# Patient Record
Sex: Female | Born: 1960
Health system: Southern US, Community
[De-identification: ages and names within clinical notes are randomized; demographics above are authoritative.]

## PROBLEM LIST (undated history)

## (undated) DIAGNOSIS — M51369 Other intervertebral disc degeneration, lumbar region without mention of lumbar back pain or lower extremity pain: Secondary | ICD-10-CM

## (undated) DIAGNOSIS — K219 Gastro-esophageal reflux disease without esophagitis: Secondary | ICD-10-CM

## (undated) DIAGNOSIS — M1990 Unspecified osteoarthritis, unspecified site: Secondary | ICD-10-CM

## (undated) DIAGNOSIS — K259 Gastric ulcer, unspecified as acute or chronic, without hemorrhage or perforation: Secondary | ICD-10-CM

## (undated) DIAGNOSIS — M5126 Other intervertebral disc displacement, lumbar region: Secondary | ICD-10-CM

## (undated) DIAGNOSIS — M5136 Other intervertebral disc degeneration, lumbar region: Secondary | ICD-10-CM

## (undated) DIAGNOSIS — J302 Other seasonal allergic rhinitis: Secondary | ICD-10-CM

## (undated) HISTORY — DX: Other intervertebral disc displacement, lumbar region: M51.26

## (undated) HISTORY — DX: Other seasonal allergic rhinitis: J30.2

## (undated) HISTORY — DX: Gastro-esophageal reflux disease without esophagitis: K21.9

## (undated) HISTORY — DX: Unspecified osteoarthritis, unspecified site: M19.90

## (undated) HISTORY — DX: Other intervertebral disc degeneration, lumbar region: M51.36

## (undated) HISTORY — DX: Other intervertebral disc degeneration, lumbar region without mention of lumbar back pain or lower extremity pain: M51.369

## (undated) HISTORY — DX: Gastric ulcer, unspecified as acute or chronic, without hemorrhage or perforation: K25.9

---

## 1998-09-25 ENCOUNTER — Encounter: Payer: Self-pay | Admitting: Gynecology

## 1998-09-25 ENCOUNTER — Ambulatory Visit (HOSPITAL_COMMUNITY): Admission: RE | Admit: 1998-09-25 | Discharge: 1998-09-25 | Payer: Self-pay | Admitting: Gynecology

## 1999-01-20 ENCOUNTER — Emergency Department (HOSPITAL_COMMUNITY): Admission: EM | Admit: 1999-01-20 | Discharge: 1999-01-20 | Payer: Self-pay

## 1999-09-16 DIAGNOSIS — K259 Gastric ulcer, unspecified as acute or chronic, without hemorrhage or perforation: Secondary | ICD-10-CM

## 1999-09-16 HISTORY — DX: Gastric ulcer, unspecified as acute or chronic, without hemorrhage or perforation: K25.9

## 2000-06-22 ENCOUNTER — Other Ambulatory Visit: Admission: RE | Admit: 2000-06-22 | Discharge: 2000-06-22 | Payer: Self-pay | Admitting: Gynecology

## 2001-04-02 ENCOUNTER — Encounter (INDEPENDENT_AMBULATORY_CARE_PROVIDER_SITE_OTHER): Payer: Self-pay | Admitting: Specialist

## 2001-04-02 ENCOUNTER — Other Ambulatory Visit: Admission: RE | Admit: 2001-04-02 | Discharge: 2001-04-02 | Payer: Self-pay | Admitting: Gastroenterology

## 2001-08-05 ENCOUNTER — Other Ambulatory Visit: Admission: RE | Admit: 2001-08-05 | Discharge: 2001-08-05 | Payer: Self-pay | Admitting: Gynecology

## 2001-09-15 HISTORY — PX: ANTERIOR AND POSTERIOR REPAIR: SHX1172

## 2002-09-01 ENCOUNTER — Other Ambulatory Visit: Admission: RE | Admit: 2002-09-01 | Discharge: 2002-09-01 | Payer: Self-pay | Admitting: *Deleted

## 2002-11-10 ENCOUNTER — Observation Stay (HOSPITAL_COMMUNITY): Admission: RE | Admit: 2002-11-10 | Discharge: 2002-11-11 | Payer: Self-pay | Admitting: *Deleted

## 2004-01-31 ENCOUNTER — Other Ambulatory Visit: Admission: RE | Admit: 2004-01-31 | Discharge: 2004-01-31 | Payer: Self-pay | Admitting: Obstetrics and Gynecology

## 2005-01-06 ENCOUNTER — Ambulatory Visit: Payer: Self-pay | Admitting: Internal Medicine

## 2005-01-13 ENCOUNTER — Ambulatory Visit: Payer: Self-pay | Admitting: Internal Medicine

## 2005-06-30 ENCOUNTER — Ambulatory Visit: Payer: Self-pay | Admitting: Internal Medicine

## 2005-07-10 ENCOUNTER — Encounter: Admission: RE | Admit: 2005-07-10 | Discharge: 2005-07-10 | Payer: Self-pay | Admitting: Internal Medicine

## 2005-07-10 ENCOUNTER — Ambulatory Visit: Payer: Self-pay | Admitting: Internal Medicine

## 2005-09-25 ENCOUNTER — Ambulatory Visit: Payer: Self-pay | Admitting: Family Medicine

## 2005-10-21 ENCOUNTER — Emergency Department (HOSPITAL_COMMUNITY): Admission: EM | Admit: 2005-10-21 | Discharge: 2005-10-21 | Payer: Self-pay | Admitting: Family Medicine

## 2006-12-18 ENCOUNTER — Encounter: Admission: RE | Admit: 2006-12-18 | Discharge: 2006-12-18 | Payer: Self-pay | Admitting: Occupational Medicine

## 2008-09-01 ENCOUNTER — Ambulatory Visit: Payer: Self-pay | Admitting: Internal Medicine

## 2008-09-01 DIAGNOSIS — R030 Elevated blood-pressure reading, without diagnosis of hypertension: Secondary | ICD-10-CM | POA: Insufficient documentation

## 2008-09-03 DIAGNOSIS — K279 Peptic ulcer, site unspecified, unspecified as acute or chronic, without hemorrhage or perforation: Secondary | ICD-10-CM | POA: Insufficient documentation

## 2008-09-03 DIAGNOSIS — J309 Allergic rhinitis, unspecified: Secondary | ICD-10-CM | POA: Insufficient documentation

## 2008-09-03 DIAGNOSIS — R32 Unspecified urinary incontinence: Secondary | ICD-10-CM | POA: Insufficient documentation

## 2008-09-06 ENCOUNTER — Encounter: Payer: Self-pay | Admitting: Internal Medicine

## 2008-09-06 LAB — CONVERTED CEMR LAB
ALT: 27 units/L (ref 0–35)
AST: 25 units/L (ref 0–37)
Albumin: 3.3 g/dL — ABNORMAL LOW (ref 3.5–5.2)
Alkaline Phosphatase: 36 units/L — ABNORMAL LOW (ref 39–117)
BUN: 13 mg/dL (ref 6–23)
Basophils Absolute: 0 10*3/uL (ref 0.0–0.1)
Basophils Relative: 0.1 % (ref 0.0–3.0)
Bilirubin Urine: NEGATIVE
Bilirubin, Direct: 0.2 mg/dL (ref 0.0–0.3)
CO2: 27 meq/L (ref 19–32)
Calcium: 8.9 mg/dL (ref 8.4–10.5)
Chloride: 105 meq/L (ref 96–112)
Cholesterol: 224 mg/dL (ref 0–200)
Creatinine, Ser: 0.7 mg/dL (ref 0.4–1.2)
Crystals: NEGATIVE
Direct LDL: 157.4 mg/dL
Eosinophils Absolute: 0.1 10*3/uL (ref 0.0–0.7)
Eosinophils Relative: 2.2 % (ref 0.0–5.0)
GFR calc Af Amer: 115 mL/min
GFR calc non Af Amer: 95 mL/min
Glucose, Bld: 87 mg/dL (ref 70–99)
HCT: 38.7 % (ref 36.0–46.0)
HDL: 44.5 mg/dL (ref >39.0)
Hemoglobin: 13.8 g/dL (ref 12.0–15.0)
Ketones, ur: NEGATIVE mg/dL
Lymphocytes Relative: 47.2 % — ABNORMAL HIGH (ref 12.0–46.0)
MCHC: 35.5 g/dL (ref 30.0–36.0)
MCV: 88.3 fL (ref 78.0–100.0)
Monocytes Absolute: 0.3 10*3/uL (ref 0.1–1.0)
Monocytes Relative: 6 % (ref 3.0–12.0)
Mucus, UA: NEGATIVE
Neutro Abs: 2.4 10*3/uL (ref 1.4–7.7)
Neutrophils Relative %: 44.5 % (ref 43.0–77.0)
Nitrite: NEGATIVE
Platelets: 302 10*3/uL (ref 150–400)
Potassium: 4 meq/L (ref 3.5–5.1)
RBC: 4.38 M/uL (ref 3.87–5.11)
RDW: 11.3 % — ABNORMAL LOW (ref 11.5–14.6)
Sodium: 138 meq/L (ref 135–145)
Specific Gravity, Urine: 1.01 (ref 1.000–1.03)
TSH: 4.98 microintl units/mL (ref 0.35–5.50)
Total Bilirubin: 0.7 mg/dL (ref 0.3–1.2)
Total CHOL/HDL Ratio: 5
Total Protein, Urine: NEGATIVE mg/dL
Total Protein: 6.6 g/dL (ref 6.0–8.3)
Triglycerides: 153 mg/dL — ABNORMAL HIGH (ref 0–149)
Urine Glucose: NEGATIVE mg/dL
Urobilinogen, UA: 0.2 (ref 0.0–1.0)
VLDL: 31 mg/dL (ref 0–40)
WBC: 5.4 10*3/uL (ref 4.5–10.5)
pH: 8 (ref 5.0–8.0)

## 2011-01-31 NOTE — Op Note (Signed)
NAME:  Lauren Conrad, Lauren Conrad                          ACCOUNT NO.:  [id number]   MEDICAL RECORD NO.:  [id number]                   PATIENT TYPE:  AMB   LOCATION:  DAY                                  FACILITY:  [facility]   PHYSICIAN:  [place] B. Earlene Plater, M.D.               DATE OF BIRTH:  17-Aug-1961   DATE OF PROCEDURE:  11/10/2002  DATE OF DISCHARGE:                                 OPERATIVE REPORT   PREOPERATIVE DIAGNOSES:  1. Cystocele.  2. Rectocele.  3. Stress urinary incontinence.  4. Uterine prolapse.   POSTOPERATIVE DIAGNOSES:  1. Cystocele.  2. Rectocele.  3. Stress urinary incontinence.  4. Uterine prolapse.   PROCEDURE:  1. Anterior and posterior repair.  2. OV and Mentor tape sling.  3. Cystoscopy.   SURGEONS:  Chester Holstein. Earlene Plater, M.D. for the anterior and posterior repair.  Sigmund I. Patsi Sears, M.D. for the sling and cystoscopy.   ANESTHESIA:  General.   FINDINGS:  Second-degree cystocele, third-degree rectocele, mild uterine  prolapse.   SPECIMENS:  None.   BLOOD LOSS:  100.   FLUIDS:  2200.   URINE OUTPUT:  675.   COMPLICATIONS:  None.   DRAINS:  Foley (vaginal pack in the vagina).   INDICATIONS:  A patient with stress urinary incontinence and associated  cystocele, rectocele, and mild uterine prolapse.  The patient had initially  been counseled regarding hysterectomy as adjunctive treatment to reduce the  possibility of recurrent prolapse in the future.  Also had a history of  intermenstrual spotting and was initially planning on hysterectomy at time  of the above procedure.  However, on additional consideration in private,  the patient had decided to forgo the hysterectomy.  This was indicated to me  in the holding room by the patient prior to administration of any sedative  medications.  We had additional discussion in the holding room about this  and the fact that hysterectomy may reduce the chance of recurrent prolapse  in the future; however,  ultimately that it was the patient's decision  whether or not to proceed with the additional surgery.  The patient decided  to forgo the hysterectomy, and we proceeded with the procedure as outlined  above.   DESCRIPTION OF PROCEDURE:  The patient taken to the operating room and  general anesthesia obtained.  She was placed in the ski position and prepped  and draped in standard fashion.  The bladder was emptied with Foley  catheter.  Exam under anesthesia showed a normal size, anteverted uterus  with slight prolapse and other findings as outlined above.   A weighted speculum was placed into the vagina and the cystocele identified.  An approximately 1.5 cm segment of fascial mucosa just below the urethra was  left untreated for Dr. Imelda Pillow sling.  The remainder of the cystocele  was marked with Allis clamps at its superior and inferior aspects.  In the  midline, the vaginal mucosa was infiltrated with 0.5% Marcaine with  epinephrine.  The vaginal mucosa was then incised sharply with a knife at  the inferior portion.  Dissection was then carried superiorly just beneath  the bladder mucosa with scissors.  Allis clamps were used to place each cut  edge on traction, and the vesicovaginal fascia was then dissected away  sharply with Metzenbaum scissors.  This was freshened on the right and then  repeated on the left side in a similar fashion.  The redundant vaginal  mucosa was then trimmed away and the fascia reapproximated with interrupted  figure-of-eight sutures of 2-0 Vicryl.  The vaginal mucosa was then closed  in a running stitch of 2-0 Vicryl.   Sigmund I. Patsi Sears, M.D. then did his OV tape procedures.  See his note  for details.   After Dr. Patsi Sears had completed his sling and cystoscopy, he proceeded  with a rectocele repair.   The perineum was grasped on each side with Allis clamps and perineum and  posterior vaginal mucosa infiltrated with 0.5% Marcaine with  epinephrine.  Perineum was incised transversely and the vaginal mucosa dissected off  sharply, Allis clamps then placed on each cut edge, and the rectovaginal  fascia dissected away sharply.  The redundant vaginal mucosa was trimmed  away and the fascia reapproximated with running stitch of 2-0 Vicryl.  The  vaginal mucosa was then closed with running stitch of 2-0 Vicryl.  Perineum  was reapproximated with the same suture and the skin closed over it with a  subcuticular 2-0 Vicryl.  A vaginal pack was then placed with Estrace cream.   The patient tolerated the procedure well, and there were no complications.  She was taken to the recovery room, awake, in satisfactory condition.                                               Gerri Spore B. Earlene Plater, M.D.    WBD/MEDQ  D:  11/10/2002  T:  11/10/2002  Job:  [id number]

## 2011-01-31 NOTE — H&P (Signed)
NAME:  Lauren Conrad, Lauren Conrad NO.:  [id number]   MEDICAL RECORD NO.:  [id number]                   PATIENT TYPE:  SPE   LOCATION:  DFTL                                 FACILITY:  [facility]   PHYSICIAN:  Gerri Spore B. Earlene Plater, M.D.               DATE OF BIRTH:  17-Feb-1961   DATE OF ADMISSION:  09/01/2002  DATE OF DISCHARGE:  09/01/2002                                HISTORY & PHYSICAL   PREOPERATIVE DIAGNOSES:  1. Symptomatic uterine prolapse.  2. Cystocele.  3. Rectocele.  4. Stress urinary incontinence.  5. Abnormal uterine bleeding.   HISTORY OF PRESENT ILLNESS:  A 50 year old white female, gravida 1, para 1,  with a greater than six month history of intermenstrual bleeding despite use  of Ortho Evra patch.  Also says she has had symptoms of leakage of urine  when laughing, straining, or coughing, has been doing Kegel exercises  without improvement in symptoms.  The patient has been evaluated by Sigmund  I. Patsi Sears, M.D. and was found to be a good candidate for a sling.   Preop evaluation included Pap smear that was normal.  Endometrial biopsy  which showed proliferative endometrium, removal of a benign endocervical  polyp, and recent cultures of gonorrhea and Chlamydia which were negative.   The patient has been offered less invasive treatments and wishes to proceed  with surgical therapy.   PAST MEDICAL HISTORY:  1. Gastroenteritis after international travel.  2. Duodenal ulcer.   PAST SURGICAL HISTORY:  None.   OBSTETRICAL HISTORY:  Forcep-assisted vaginal delivery x 1, 8 pounds 10  ounces.   MEDICATIONS:  Ortho Evra patch and multivitamin.   ALLERGIES:  ERYTHROMYCIN.   SOCIAL HISTORY:  Occasional alcohol.  No tobacco or other drugs.   FAMILY HISTORY:  Diabetes, heart disease, hypertension, and mother and  paternal aunt with ovarian cancer in the 91's and 81's.   PHYSICAL EXAMINATION:  VITAL SIGNS:  Blood pressure 112/64, weight 137,  height  5 feet two inches.  GENERAL:  Alert and oriented, in no acute distress.  SKIN:  Warm and dry, no lesions.  HEART:  Regular rate and rhythm.  LUNGS:  Clear to auscultation.  ABDOMEN:  Liver, spleen, no evidence of hernia.  LYMPH NODES:  Surveyed and negative in neck, axilla, and groin.  BREASTS:  No dominant masses, nipple discharge, or axillary adenopathy.  PELVIC:  Normal external genitalia.  Vagina second-degree cystocele and  rectocele with a hypermobile urethra.  Uterus has small amount of descensus  and is not enlarged.  Rectovaginal exam confirms both findings.   ASSESSMENT:  Pelvic organ prolapse with stress urinary incontinence and  irregular bleeding.   PLAN:  1. Laparoscopically-assisted vaginal hysterectomy.  2. Anterior and posterior repair.  3. Sling procedure by Lynelle Smoke I. Patsi Sears, M.D.  4. Discussed with patient ovarian conservation versus removal, and she would     like to  keep the ovaries if they appear normal.                                               Gerri Spore B. Earlene Plater, M.D.    WBD/MEDQ  D:  10/25/2002  T:  10/25/2002  Job:  [id number]

## 2011-01-31 NOTE — Op Note (Signed)
   NAME:  Lauren Conrad, HUHTA                          ACCOUNT NO.:  [id number]   MEDICAL RECORD NO.:  [id number]                   PATIENT TYPE:  AMB   LOCATION:  DAY                                  FACILITY:  [facility]   PHYSICIAN:  Sigmund I. Patsi Sears, M.D.         DATE OF BIRTH:  1961/05/06   DATE OF PROCEDURE:  11/10/2002  DATE OF DISCHARGE:                                 OPERATIVE REPORT   PREOPERATIVE DIAGNOSES:  Urinary incontinence.   POSTOPERATIVE DIAGNOSES:  Urinary incontinence.   OPERATION:  Mentor OB obturator pubovaginal sling.   SURGEON:  Sigmund I. Patsi Sears, M.D.   ANESTHESIA:  General endotracheal.   PREPARATION:  After appropriate preanesthesia, the patient was brought to  the operating room, placed on the operating table in the dorsal supine  position where general endotracheal anesthesia was introduced. She was  placed in the dorsal lithotomy position by Dr. Earlene Plater, and underwent anterior  vaginal vault repair. At the conclusion of this portion of the operation, 5  mL of Marcaine 0.5 with epinephrine 1:200,000 was injected into the anterior  pubovaginal cervical tissue, right around the urethra in the periurethral  area. An incision was made over the urethra and subcutaneous tissue and was  dissected bilaterally. A stab wound was made just 1 cm lateral to the  palpation of the obturator fossa with thumb on the obturator foramen, and  the passer was placed, brought into the midline wound. The tape is passed  backwards, the same incision is repeated on the opposite side, and tape is  again placed. A right angled clamp is kept behind the tape to make certain  that the tape is not too tight. The tape is cut beneath the surface of the  skin. The stab wounds are closed with Dermabond. The wound is closed in two  layers with 3-0 Vicryl suture. The patient tolerated the procedure well. She  was awakened after a rectocele repair and taken to the recovery room in good  condition.                                               Sigmund I. Patsi Sears, M.D.    SIT/MEDQ  D:  11/10/2002  T:  11/10/2002  Job:  [id number]   cc:   Gerri Spore B. Earlene Plater, M.D.  [address]  [place]  [place] [address]  Fax: [phone]

## 2013-07-29 ENCOUNTER — Telehealth: Payer: Self-pay | Admitting: Gastroenterology

## 2013-07-29 NOTE — Telephone Encounter (Signed)
Patient is requesting to switch care from Dr. Arlyce Dice to Dr. Juanda Chance. She states she was not satisfied with Dr. Arlyce Dice and wishes to see someone else.

## 2013-07-29 NOTE — Telephone Encounter (Signed)
Dr Arlyce Dice, OK for pt to switch to Dr Juanda Chance? Thanks.

## 2013-08-01 NOTE — Telephone Encounter (Signed)
OK 

## 2013-08-01 NOTE — Telephone Encounter (Signed)
Unable to schedule an appt at this time. Will call pt back to schedule a colon once February schedule is out for Dr. Juanda Chance.

## 2013-08-01 NOTE — Telephone Encounter (Signed)
ok 

## 2013-08-01 NOTE — Telephone Encounter (Signed)
Dr Brodie, will you accept this pt? Thanks. 

## 2013-08-08 ENCOUNTER — Encounter: Payer: Self-pay | Admitting: Internal Medicine

## 2013-10-12 ENCOUNTER — Ambulatory Visit (AMBULATORY_SURGERY_CENTER): Payer: Self-pay | Admitting: *Deleted

## 2013-10-12 VITALS — Ht 62.0 in | Wt 154.8 lb

## 2013-10-12 DIAGNOSIS — Z1211 Encounter for screening for malignant neoplasm of colon: Secondary | ICD-10-CM

## 2013-10-12 MED ORDER — MOVIPREP 100 G PO SOLR: ORAL | Status: DC

## 2013-10-12 NOTE — Progress Notes (Signed)
No allergies to eggs or soy. No problems with anesthesia.  

## 2013-10-19 ENCOUNTER — Encounter: Payer: Self-pay | Admitting: Internal Medicine

## 2013-10-28 ENCOUNTER — Encounter: Payer: Self-pay | Admitting: Internal Medicine

## 2013-10-28 ENCOUNTER — Ambulatory Visit (AMBULATORY_SURGERY_CENTER): Payer: BC Managed Care – PPO | Admitting: Internal Medicine

## 2013-10-28 VITALS — BP 112/70 | HR 76 | Temp 96.8°F | Resp 21 | Ht 62.0 in | Wt 154.0 lb

## 2013-10-28 DIAGNOSIS — Z1211 Encounter for screening for malignant neoplasm of colon: Secondary | ICD-10-CM

## 2013-10-28 MED ORDER — SODIUM CHLORIDE 0.9 % IV SOLN: 500.0000 mL | INTRAVENOUS | Status: DC

## 2013-10-28 NOTE — Patient Instructions (Signed)
YOU HAD AN ENDOSCOPIC PROCEDURE TODAY AT THE [facility]: Refer to the procedure report that was given to you for any specific questions about what was found during the examination.  If the procedure report does not answer your questions, please call your gastroenterologist to clarify.  If you requested that your care partner not be given the details of your procedure findings, then the procedure report has been included in a sealed envelope for you to review at your convenience later.  YOU SHOULD EXPECT: Some feelings of bloating in the abdomen. Passage of more gas than usual.  Walking can help get rid of the air that was put into your GI tract during the procedure and reduce the bloating. If you had a lower endoscopy (such as a colonoscopy or flexible sigmoidoscopy) you may notice spotting of blood in your stool or on the toilet paper. If you underwent a bowel prep for your procedure, then you may not have a normal bowel movement for a few days.  DIET: Your first meal following the procedure should be a light meal and then it is ok to progress to your normal diet.  A half-sandwich or bowl of soup is an example of a good first meal.  Heavy or fried foods are harder to digest and may make you feel nauseous or bloated.  Likewise meals heavy in dairy and vegetables can cause extra gas to form and this can also increase the bloating.  Drink plenty of fluids but you should avoid alcoholic beverages for 24 hours.  ACTIVITY: Your care partner should take you home directly after the procedure.  You should plan to take it easy, moving slowly for the rest of the day.  You can resume normal activity the day after the procedure however you should NOT DRIVE or use heavy machinery for 24 hours (because of the sedation medicines used during the test).    SYMPTOMS TO REPORT IMMEDIATELY: A gastroenterologist can be reached at any hour.  During normal business hours, 8:30 AM to 5:00 PM Monday through Friday,  call [phone].  After hours and on weekends, please call the GI answering service at [phone] who will take a message and have the physician on call contact you.   Following lower endoscopy (colonoscopy or flexible sigmoidoscopy):  Excessive amounts of blood in the stool  Significant tenderness or worsening of abdominal pains  Swelling of the abdomen that is new, acute  Fever of 100F or higher    FOLLOW UP: If any biopsies were taken you will be contacted by phone or by letter within the next 1-3 weeks.  Call your gastroenterologist if you have not heard about the biopsies in 3 weeks.  Our staff will call the home number listed on your records the next business day following your procedure to check on you and address any questions or concerns that you may have at that time regarding the information given to you following your procedure. This is a courtesy call and so if there is no answer at the home number and we have not heard from you through the emergency physician on call, we will assume that you have returned to your regular daily activities without incident.  SIGNATURES/CONFIDENTIALITY: You and/or your care partner have signed paperwork which will be entered into your electronic medical record.  These signatures attest to the fact that that the information above on your After Visit Summary has been reviewed and is understood.  Full responsibility of the confidentiality   this discharge information lies with you and/or your care-partner.  Diverticulosis and high fiber diet information given.  Next colonoscopy 10 years-2025. 

## 2013-10-28 NOTE — Op Note (Signed)
[place]  [address] [place], [address]   COLONOSCOPY PROCEDURE REPORT  PATIENT: Lauren Conrad, Lauren Conrad  MR#: [id number] BIRTHDATE: [birth year] , 53  yrs. old GENDER: Female ENDOSCOPIST: Lafayette Dragon, MD REFERRED WC:[id number] Dellis Filbert, M.D. , Dr M.Krietz PROCEDURE DATE:  10/28/2013 PROCEDURE:   Colonoscopy, screening First Screening Colonoscopy - Avg.  risk and is 50 yrs.  old or older - No.  Prior Negative Screening - Now for repeat screening. 10 or more years since last screening  History of Adenoma - Now for follow-up colonoscopy & has been > or = to 3 yrs.  N/A  Polyps Removed Today? No.  Recommend repeat exam, <10 yrs? No. ASA CLASS:   Class I INDICATIONS:Average risk patient for colon cancer. MEDICATIONS: MAC sedation, administered by CRNA and propofol (Diprivan) 200mg  IV  DESCRIPTION OF PROCEDURE:   After the risks benefits and alternatives of the procedure were thoroughly explained, informed consent was obtained.  A digital rectal exam revealed no abnormalities of the rectum.   The LB [id number]-H190 [id number]  endoscope was introduced through the anus and advanced to the cecum, which was identified by both the appendix and ileocecal valve. No adverse events experienced.   The quality of the prep was good, using MoviPrep  The instrument was then slowly withdrawn as the colon was fully examined.      COLON FINDINGS: Mild diverticulosis was noted in the descending colon.  Retroflexed views revealed no abnormalities. The time to cecum=11 minutes 57 seconds.  Withdrawal time=6 minutes 1 seconds. The scope was withdrawn and the procedure completed. COMPLICATIONS: There were no complications.  ENDOSCOPIC IMPRESSION: Mild diverticulosis was noted in the descending colon  RECOMMENDATIONS: 1.  High fiber diet 2.   recall colon 10 years   eSigned:  Lafayette Dragon, MD 10/28/2013 8:45 AM   cc:   PATIENT NAME:  [name], [name] MR#:  [id number]

## 2013-10-28 NOTE — Progress Notes (Signed)
Procedure ends, to recovery, report given and VSS. 

## 2013-10-31 ENCOUNTER — Telehealth: Payer: Self-pay | Admitting: *Deleted

## 2013-10-31 NOTE — Telephone Encounter (Signed)
  Follow up Call-  Call back number 10/28/2013  Post procedure Call Back phone  # [phone] cell  Permission to leave phone message Yes     Patient questions:  Do you have a fever, pain , or abdominal swelling? no Pain Score  0 *  Have you tolerated food without any problems? yes  Have you been able to return to your normal activities? yes  Do you have any questions about your discharge instructions: Diet   no Medications  no Follow up visit  no  Do you have questions or concerns about your Care? no  Actions: * If pain score is 4 or above: No action needed, pain <4.

## 2017-01-08 ENCOUNTER — Ambulatory Visit (INDEPENDENT_AMBULATORY_CARE_PROVIDER_SITE_OTHER): Payer: BLUE CROSS/BLUE SHIELD | Admitting: Obstetrics & Gynecology

## 2017-01-08 ENCOUNTER — Encounter: Payer: Self-pay | Admitting: Obstetrics & Gynecology

## 2017-01-08 VITALS — BP 144/90 | Ht 62.75 in | Wt 166.0 lb

## 2017-01-08 DIAGNOSIS — Z1151 Encounter for screening for human papillomavirus (HPV): Secondary | ICD-10-CM | POA: Diagnosis not present

## 2017-01-08 DIAGNOSIS — N952 Postmenopausal atrophic vaginitis: Secondary | ICD-10-CM

## 2017-01-08 DIAGNOSIS — Z01411 Encounter for gynecological examination (general) (routine) with abnormal findings: Secondary | ICD-10-CM | POA: Diagnosis not present

## 2017-01-08 NOTE — Progress Notes (Signed)
Lauren Conrad [birth year] [id number]   History:    56 y.o.  G1P1 married.  Established patient presenting for annual gyn exam.  Menopause.  No HRT.  No PMB.  No pelvic pain.  No recent Cystitis.  C/O irritation/inflammation at urethral meatus on-off since Cystoscopy.  No current Sx.  Breasts wnl.  Stress at work.  Physically active.  Past medical history,surgical history, family history and social history were all reviewed and documented in the EPIC chart.  Gynecologic History No LMP recorded. Patient is not currently having periods (Reason: Perimenopausal). Contraception: post menopausal status Last Pap: 2016. Results were: normal Last mammogram: 2016. Results were: normal Colonoscopy 2015  Obstetric History OB History  Gravida Para Term Preterm AB Living  1 1       1   SAB TAB Ectopic Multiple Live Births               # Outcome Date GA Lbr Len/2nd Weight Sex Delivery Anes PTL Lv  1 Para                ROS: A ROS was performed and pertinent positives and negatives are included in the history.  GENERAL: No fevers or chills. HEENT: No change in vision, no earache, sore throat or sinus congestion. NECK: No pain or stiffness. CARDIOVASCULAR: No chest pain or pressure. No palpitations. PULMONARY: No shortness of breath, cough or wheeze. GASTROINTESTINAL: No abdominal pain, nausea, vomiting or diarrhea, melena or bright red blood per rectum. GENITOURINARY: No urinary frequency, urgency, hesitancy or dysuria. MUSCULOSKELETAL: No joint or muscle pain, no back pain, no recent trauma. DERMATOLOGIC: No rash, no itching, no lesions. ENDOCRINE: No polyuria, polydipsia, no heat or cold intolerance. No recent change in weight. HEMATOLOGICAL: No anemia or easy bruising or bleeding. NEUROLOGIC: No headache, seizures, numbness, tingling or weakness. PSYCHIATRIC: No depression, no loss of interest in normal activity or change in sleep pattern.     Exam:   BP (!) 144/90   Ht 5' 2.75" (1.594  m)   Wt 166 lb (75.3 kg)   BMI 29.64 kg/m   Body mass index is 29.64 kg/m.  General appearance : Well developed well nourished female. No acute distress HEENT: Eyes: no retinal hemorrhage or exudates,  Neck supple, trachea midline, no carotid bruits, no thyroidmegaly Lungs: Clear to auscultation, no rhonchi or wheezes, or rib retractions  Heart: Regular rate and rhythm, no murmurs or gallops Breast:Examined in sitting and supine position were symmetrical in appearance, no palpable masses or tenderness,  no skin retraction, no nipple inversion, no nipple discharge, no skin discoloration, no axillary or supraclavicular lymphadenopathy Abdomen: no palpable masses or tenderness, no rebound or guarding Extremities: no edema or skin discoloration or tenderness  Pelvic:  Bartholin, Urethra, Skene Glands: Within normal limits.  No sign of inflammation around urethra today.             Vagina: No gross lesions or discharge  Cervix: No gross lesions or discharge.  Pap done.  Uterus  AV, normal size, shape and consistency, non-tender and mobile  Adnexa  Without masses or tenderness  Anus and perineum  normal    Assessment/Plan:  57 y.o. female for annual exam  1. Encounter for gynecological examination with abnormal finding Normal Gyn exam. Pap/HPV pending. Normal Breast exam.  Declines annual screening Mammo.  2. Atrophic vaginitis Asymptomatic.  No current peri-urethral irritation/inflammation.  Counseling done.  Will continue warm water with shower head.  Vaseline to prevent irritation on  very active days recommended.   Princess Bruins MD, 4:50 PM 01/08/2017

## 2017-01-08 NOTE — Patient Instructions (Addendum)
Your Annual/Gyn exam was normal today.  A Pap test was done.  I will let you know the result as soon as available.  Your Breast exam was normal and you declined doing a screening Mammogram every year.  I encourage you to continue with a healthy nutrition and regular physical activity.  Enjoy your Lex[employer]!  If your irritation/inflammation recurs around the urethra, don't hesitate to call so that I can examine you when the problem is present.  Trilby, it was a pleasure to see you today!

## 2017-01-09 NOTE — Addendum Note (Signed)
Addended by: Thurnell Garbe A on: 01/09/2017 10:08 AM   Modules accepted: Orders

## 2017-01-13 LAB — PAP, TP IMAGING W/ HPV RNA, RFLX HPV TYPE 16,18/45: HPV MRNA, HIGH RISK: NOT DETECTED

## 2017-03-11 DIAGNOSIS — M545 Low back pain: Secondary | ICD-10-CM | POA: Diagnosis not present

## 2017-03-11 DIAGNOSIS — M5442 Lumbago with sciatica, left side: Secondary | ICD-10-CM | POA: Diagnosis not present

## 2017-04-02 ENCOUNTER — Other Ambulatory Visit: Payer: Self-pay | Admitting: Internal Medicine

## 2017-04-02 DIAGNOSIS — Z0000 Encounter for general adult medical examination without abnormal findings: Secondary | ICD-10-CM | POA: Diagnosis not present

## 2017-04-02 DIAGNOSIS — M5416 Radiculopathy, lumbar region: Secondary | ICD-10-CM | POA: Diagnosis not present

## 2017-04-02 DIAGNOSIS — Z780 Asymptomatic menopausal state: Secondary | ICD-10-CM | POA: Diagnosis not present

## 2017-04-08 DIAGNOSIS — Z780 Asymptomatic menopausal state: Secondary | ICD-10-CM | POA: Diagnosis not present

## 2017-04-16 ENCOUNTER — Ambulatory Visit: Admission: RE | Admit: 2017-04-16 | Payer: 59 | Source: Ambulatory Visit

## 2017-04-29 DIAGNOSIS — Z85828 Personal history of other malignant neoplasm of skin: Secondary | ICD-10-CM | POA: Diagnosis not present

## 2017-04-29 DIAGNOSIS — D225 Melanocytic nevi of trunk: Secondary | ICD-10-CM | POA: Diagnosis not present

## 2017-04-29 DIAGNOSIS — D2272 Melanocytic nevi of left lower limb, including hip: Secondary | ICD-10-CM | POA: Diagnosis not present

## 2017-05-04 DIAGNOSIS — H04123 Dry eye syndrome of bilateral lacrimal glands: Secondary | ICD-10-CM | POA: Diagnosis not present

## 2017-06-17 DIAGNOSIS — M5416 Radiculopathy, lumbar region: Secondary | ICD-10-CM | POA: Diagnosis not present

## 2017-06-17 DIAGNOSIS — M5136 Other intervertebral disc degeneration, lumbar region: Secondary | ICD-10-CM | POA: Diagnosis not present

## 2017-06-18 ENCOUNTER — Other Ambulatory Visit: Payer: Self-pay | Admitting: Physical Medicine and Rehabilitation

## 2017-06-18 DIAGNOSIS — M5416 Radiculopathy, lumbar region: Secondary | ICD-10-CM

## 2017-06-29 ENCOUNTER — Ambulatory Visit
Admission: RE | Admit: 2017-06-29 | Discharge: 2017-06-29 | Disposition: A | Discharge status: Home / Self Care | Payer: 59 | Source: Ambulatory Visit | Attending: Physical Medicine and Rehabilitation | Admitting: Physical Medicine and Rehabilitation

## 2017-06-29 DIAGNOSIS — M5416 Radiculopathy, lumbar region: Secondary | ICD-10-CM

## 2017-06-29 DIAGNOSIS — M48061 Spinal stenosis, lumbar region without neurogenic claudication: Secondary | ICD-10-CM | POA: Diagnosis not present

## 2017-09-04 DIAGNOSIS — M5126 Other intervertebral disc displacement, lumbar region: Secondary | ICD-10-CM | POA: Diagnosis not present

## 2017-09-04 DIAGNOSIS — M25561 Pain in right knee: Secondary | ICD-10-CM | POA: Diagnosis not present

## 2017-09-04 DIAGNOSIS — M5416 Radiculopathy, lumbar region: Secondary | ICD-10-CM | POA: Diagnosis not present

## 2017-09-24 DIAGNOSIS — M25562 Pain in left knee: Secondary | ICD-10-CM | POA: Diagnosis not present

## 2017-09-24 DIAGNOSIS — M25561 Pain in right knee: Secondary | ICD-10-CM | POA: Diagnosis not present

## 2017-09-24 DIAGNOSIS — M25572 Pain in left ankle and joints of left foot: Secondary | ICD-10-CM | POA: Diagnosis not present

## 2017-09-27 DIAGNOSIS — J069 Acute upper respiratory infection, unspecified: Secondary | ICD-10-CM | POA: Diagnosis not present

## 2017-10-02 DIAGNOSIS — M5442 Lumbago with sciatica, left side: Secondary | ICD-10-CM | POA: Diagnosis not present

## 2017-10-02 DIAGNOSIS — G8929 Other chronic pain: Secondary | ICD-10-CM | POA: Diagnosis not present

## 2017-10-16 DIAGNOSIS — G8929 Other chronic pain: Secondary | ICD-10-CM | POA: Diagnosis not present

## 2017-10-16 DIAGNOSIS — M5442 Lumbago with sciatica, left side: Secondary | ICD-10-CM | POA: Diagnosis not present

## 2018-04-29 DIAGNOSIS — Z85828 Personal history of other malignant neoplasm of skin: Secondary | ICD-10-CM | POA: Diagnosis not present

## 2018-04-29 DIAGNOSIS — D485 Neoplasm of uncertain behavior of skin: Secondary | ICD-10-CM | POA: Diagnosis not present

## 2018-04-29 DIAGNOSIS — D225 Melanocytic nevi of trunk: Secondary | ICD-10-CM | POA: Diagnosis not present

## 2018-04-29 DIAGNOSIS — D2272 Melanocytic nevi of left lower limb, including hip: Secondary | ICD-10-CM | POA: Diagnosis not present

## 2018-06-28 DIAGNOSIS — M5126 Other intervertebral disc displacement, lumbar region: Secondary | ICD-10-CM | POA: Diagnosis not present

## 2018-06-28 DIAGNOSIS — M5416 Radiculopathy, lumbar region: Secondary | ICD-10-CM | POA: Diagnosis not present

## 2018-07-12 ENCOUNTER — Encounter: Payer: 59 | Admitting: Obstetrics & Gynecology

## 2018-08-05 ENCOUNTER — Encounter: Payer: Self-pay | Admitting: Obstetrics & Gynecology

## 2018-08-05 ENCOUNTER — Ambulatory Visit (INDEPENDENT_AMBULATORY_CARE_PROVIDER_SITE_OTHER): Payer: 59 | Admitting: Obstetrics & Gynecology

## 2018-08-05 VITALS — BP 130/80 | Ht 62.25 in | Wt 162.5 lb

## 2018-08-05 DIAGNOSIS — Z23 Encounter for immunization: Secondary | ICD-10-CM

## 2018-08-05 DIAGNOSIS — R300 Dysuria: Secondary | ICD-10-CM

## 2018-08-05 DIAGNOSIS — Z01419 Encounter for gynecological examination (general) (routine) without abnormal findings: Secondary | ICD-10-CM

## 2018-08-05 DIAGNOSIS — Z780 Asymptomatic menopausal state: Secondary | ICD-10-CM

## 2018-08-05 NOTE — Progress Notes (Signed)
Lauren Conrad [birth year] [id number]   History:    57 y.o. G1P1L1 Married x 5 yrs  RP:  Established patient presenting for annual gyn exam   HPI: Menopause, well on no HRT.  No PMB.  No pelvic pain.  Bulging disc.  BMI 29.48.  Will do water fitness with an exercise bath at home.  Past medical history,surgical history, family history and social history were all reviewed and documented in the EPIC chart.  Gynecologic History No LMP recorded. (Menstrual status: Perimenopausal). Contraception: post menopausal status Last Pap: 12/2016. Results were: Negative Last mammogram: 02/2015 Negative.  Declined Bone Density: Will obtain report Colonoscopy: 2015  Obstetric History OB History  Gravida Para Term Preterm AB Living  '1 1       1  ' SAB TAB Ectopic Multiple Live Births               # Outcome Date GA Lbr Len/2nd Weight Sex Delivery Anes PTL Lv  1 Para              ROS: A ROS was performed and pertinent positives and negatives are included in the history.  GENERAL: No fevers or chills. HEENT: No change in vision, no earache, sore throat or sinus congestion. NECK: No pain or stiffness. CARDIOVASCULAR: No chest pain or pressure. No palpitations. PULMONARY: No shortness of breath, cough or wheeze. GASTROINTESTINAL: No abdominal pain, nausea, vomiting or diarrhea, melena or bright red blood per rectum. GENITOURINARY: No urinary frequency, urgency, hesitancy or dysuria. MUSCULOSKELETAL: No joint or muscle pain, no back pain, no recent trauma. DERMATOLOGIC: No rash, no itching, no lesions. ENDOCRINE: No polyuria, polydipsia, no heat or cold intolerance. No recent change in weight. HEMATOLOGICAL: No anemia or easy bruising or bleeding. NEUROLOGIC: No headache, seizures, numbness, tingling or weakness. PSYCHIATRIC: No depression, no loss of interest in normal activity or change in sleep pattern.     Exam:   BP 130/80   Ht 5' 2.25" (1.581 m)   Wt 162 lb 8 oz (73.7 kg)   BMI 29.48 kg/m    Body mass index is 29.48 kg/m.  General appearance : Well developed well nourished female. No acute distress HEENT: Eyes: no retinal hemorrhage or exudates,  Neck supple, trachea midline, no carotid bruits, no thyroidmegaly Lungs: Clear to auscultation, no rhonchi or wheezes, or rib retractions  Heart: Regular rate and rhythm, no murmurs or gallops Breast:Examined in sitting and supine position were symmetrical in appearance, no palpable masses or tenderness,  no skin retraction, no nipple inversion, no nipple discharge, no skin discoloration, no axillary or supraclavicular lymphadenopathy Abdomen: no palpable masses or tenderness, no rebound or guarding Extremities: no edema or skin discoloration or tenderness  Pelvic: Vulva: Normal             Vagina: No gross lesions or discharge  Cervix: No gross lesions or discharge  Uterus  AV, normal size, shape and consistency, non-tender and mobile  Adnexa  Without masses or tenderness  Anus: Normal  Urine analysis: Yellow clear, protein negative, nitrites negative, white blood cells negative, red blood cells 10-20, no bacteria.  Assessment/Plan:  57 y.o. female for annual exam   1. Well female exam with routine gynecological exam Normal gynecologic exam.  Pap test was negative in April 2018.  Will repeat at 2 to 3 years.  Breast exam normal.  Recommend organizing a screening mammogram now, but patient declines after counseling.  Follow-up here for fasting health labs.  Body mass index  29.48.  Recommend decrease calories/carbs in diet and increased aerobic activities to 5 times a week with weightlifting every 2 days. - CBC; Future - Comp Met (CMET); Future - TSH; Future - Lipid panel; Future - VITAMIN D 25 Hydroxy (Vit-D Deficiency, Fractures); Future  2. Postmenopausal Well on no hormone replacement therapy.  No postmenopausal bleeding.  Recommend vitamin D supplements, calcium intake of 1.5 g/day and regular weightbearing physical  activities.  Princess Bruins MD, 4:19 PM 08/05/2018

## 2018-08-07 LAB — URINALYSIS, COMPLETE W/RFL CULTURE
BILIRUBIN URINE: NEGATIVE
Bacteria, UA: NONE SEEN /HPF
Glucose, UA: NEGATIVE
HYALINE CAST: NONE SEEN /LPF
KETONES UR: NEGATIVE
Leukocyte Esterase: NEGATIVE
Nitrites, Initial: NEGATIVE
Protein, ur: NEGATIVE
SPECIFIC GRAVITY, URINE: 1.01 (ref 1.001–1.03)
WBC UA: NONE SEEN /HPF (ref 0–5)
pH: 6.5 (ref 5.0–8.0)

## 2018-08-07 LAB — URINE CULTURE
MICRO NUMBER:: 91410902
SPECIMEN QUALITY: ADEQUATE

## 2018-08-07 LAB — CULTURE INDICATED

## 2018-08-11 ENCOUNTER — Encounter: Payer: Self-pay | Admitting: Obstetrics & Gynecology

## 2018-08-11 NOTE — Patient Instructions (Signed)
1. Well female exam with routine gynecological exam Normal gynecologic exam.  Pap test was negative in April 2018.  Will repeat at 2 to 3 years.  Breast exam normal.  Recommend organizing a screening mammogram now, but patient declines after counseling.  Follow-up here for fasting health labs.  Body mass index 29.48.  Recommend decrease calories/carbs in diet and increased aerobic activities to 5 times a week with weightlifting every 2 days. - CBC; Future - Comp Met (CMET); Future - TSH; Future - Lipid panel; Future - VITAMIN D 25 Hydroxy (Vit-D Deficiency, Fractures); Future  2. Postmenopausal Well on no hormone replacement therapy.  No postmenopausal bleeding.  Recommend vitamin D supplements, calcium intake of 1.5 g/day and regular weightbearing physical activities.  Lauren Conrad, it was a pleasure seeing you today!  I will inform you of your results as soon as they are available.

## 2018-08-19 ENCOUNTER — Other Ambulatory Visit: Payer: 59

## 2018-08-24 DIAGNOSIS — Z719 Counseling, unspecified: Secondary | ICD-10-CM | POA: Diagnosis not present

## 2018-09-29 IMAGING — MR MR LUMBAR SPINE W/O CM
4 of 5 series · 18 of 48 positions shown · non-contrast
Comparison: CT abdomen and pelvis 02/05/2015.

CLINICAL DATA: Chronic low back pain and left leg pain.

EXAM:
MRI LUMBAR SPINE WITHOUT CONTRAST
TECHNIQUE: Multiplanar, multisequence MR imaging of the lumbar spine was
performed. No intravenous contrast was administered.

[Series 6: T2 · sagittal · 4.0mm · 0.73mm/px · 6 of 14 slices shown (1 of 2)]
[im 1/14]
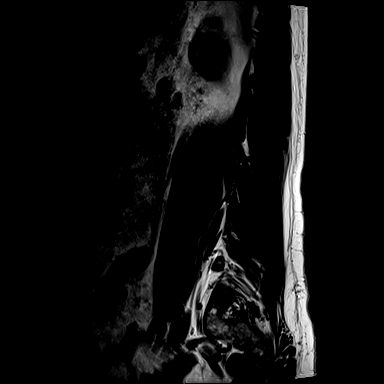
[im 3/14]
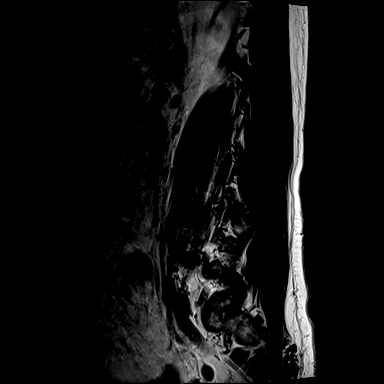
[im 6/14]
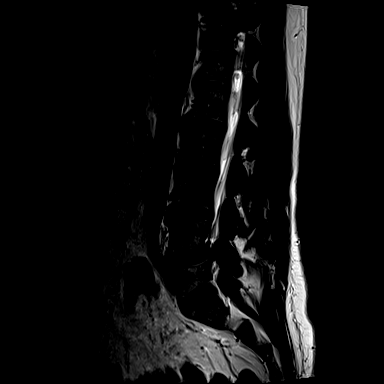
[im 8/14]
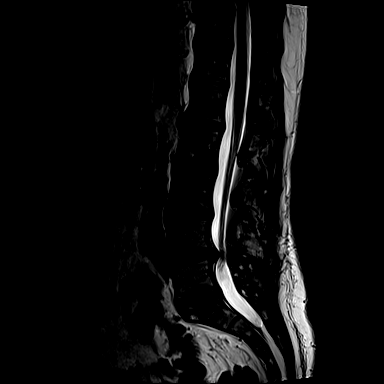
[im 11/14]
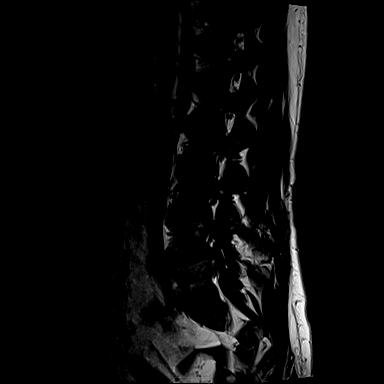
[im 14/14]
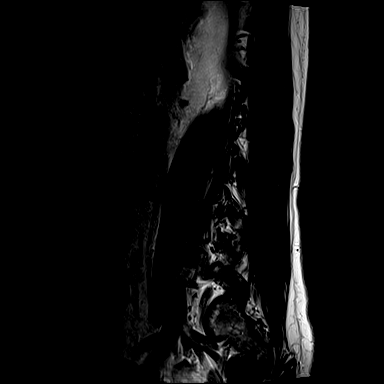

[Series 7: T1 · sagittal · 4.0mm · 0.73mm/px · 3 of 14 slices shown (1 of 2)]
[im 3/14]
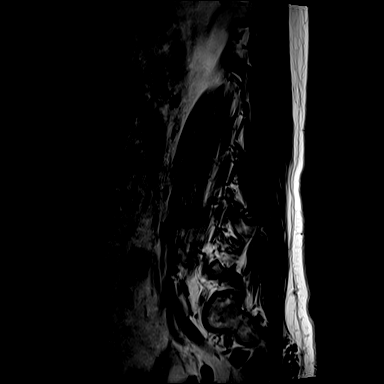
[im 8/14]
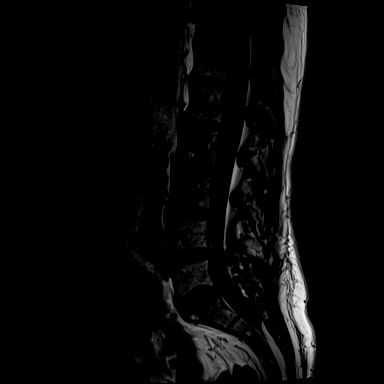
[im 14/14]
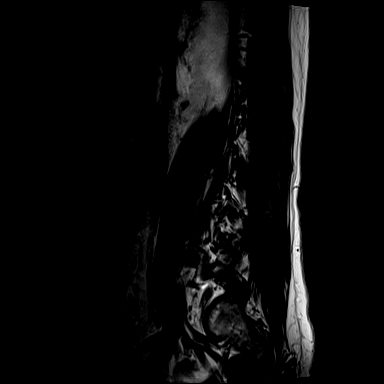

[Series 13: T2 · axial · 4.0mm · 0.28mm/px · z∈[-45,+115]mm · 6 of 38 slices shown (2 of 2)]
[im 1/38]
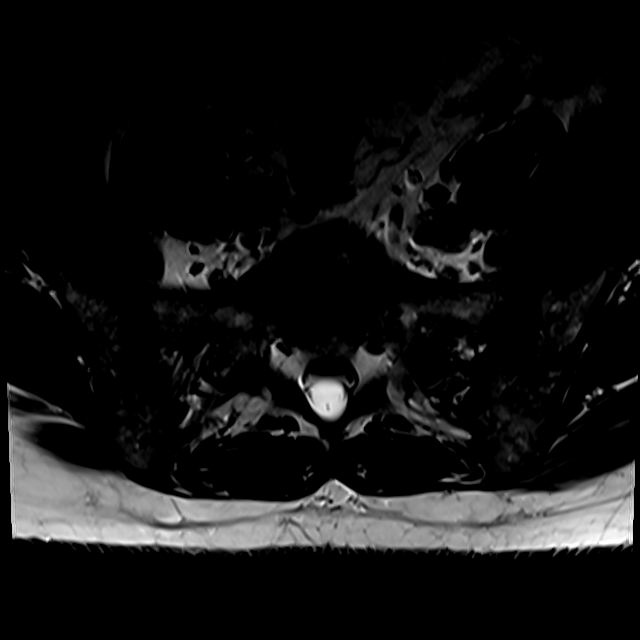
[im 6/38]
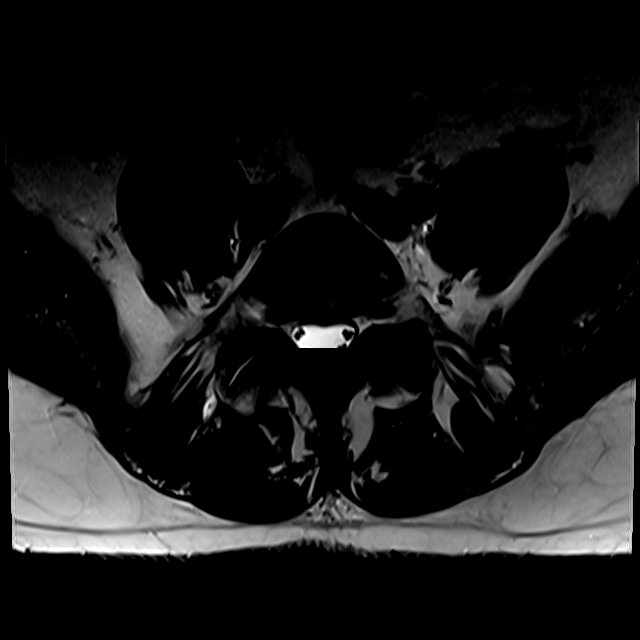
[im 11/38]
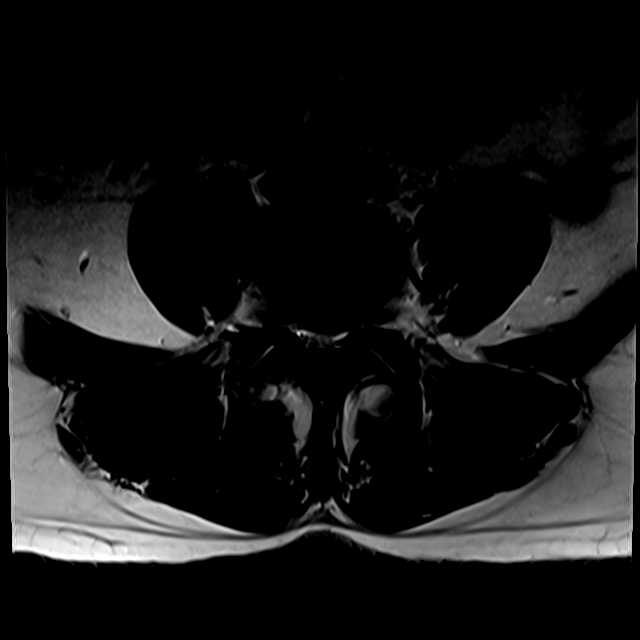
[im 16/38]
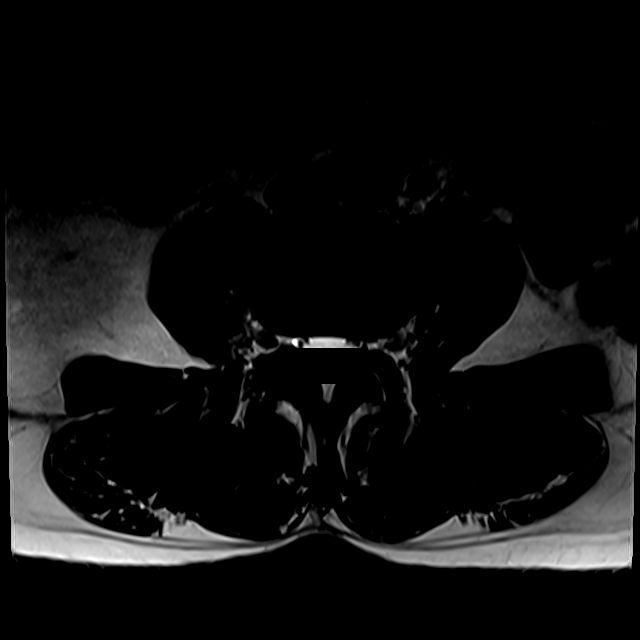
[im 19/38]
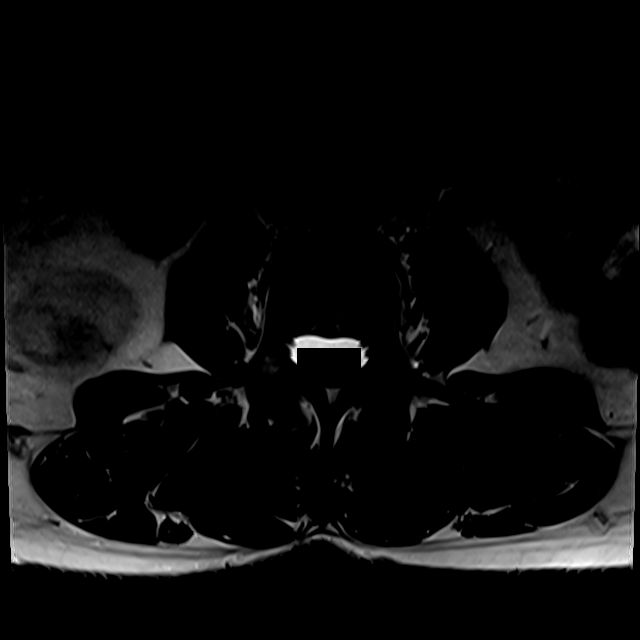
[im 32/38]
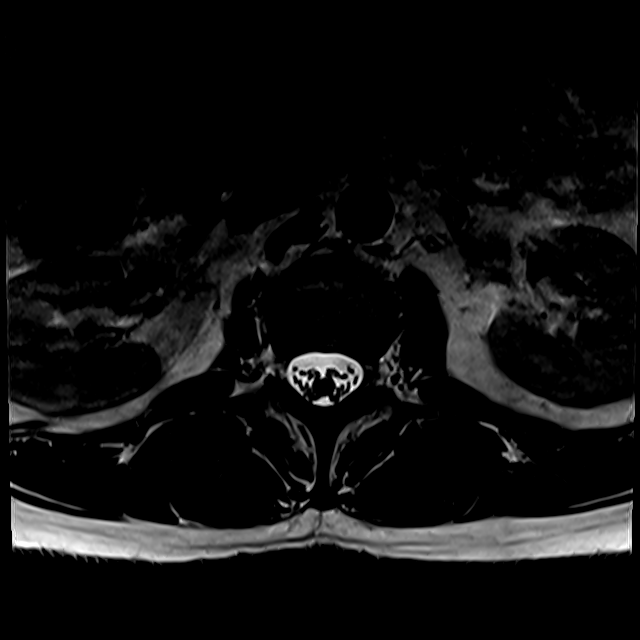

[Series 100: T1 · axial · 4.0mm · 0.28mm/px · z∈[-20,+115]mm · 3 of 38 slices shown (2 of 2)]
[im 6/38]
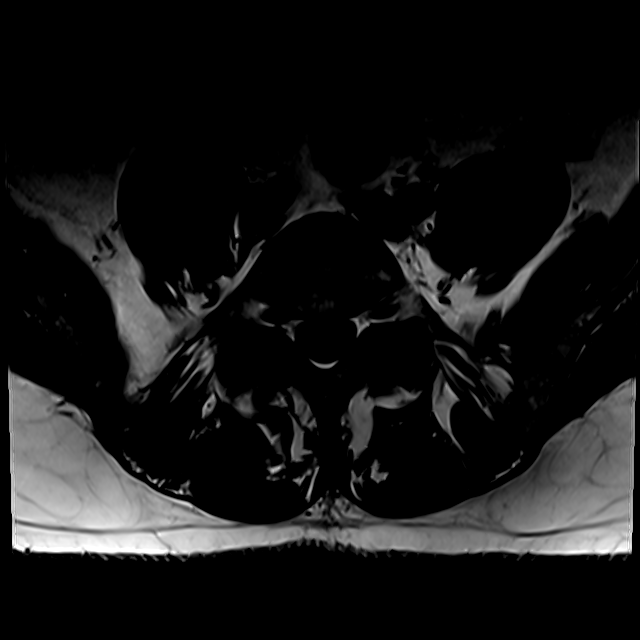
[im 19/38]
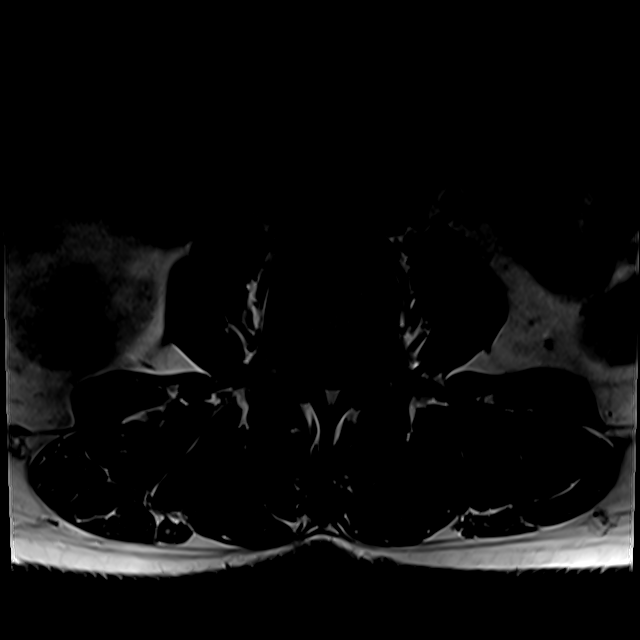
[im 32/38]
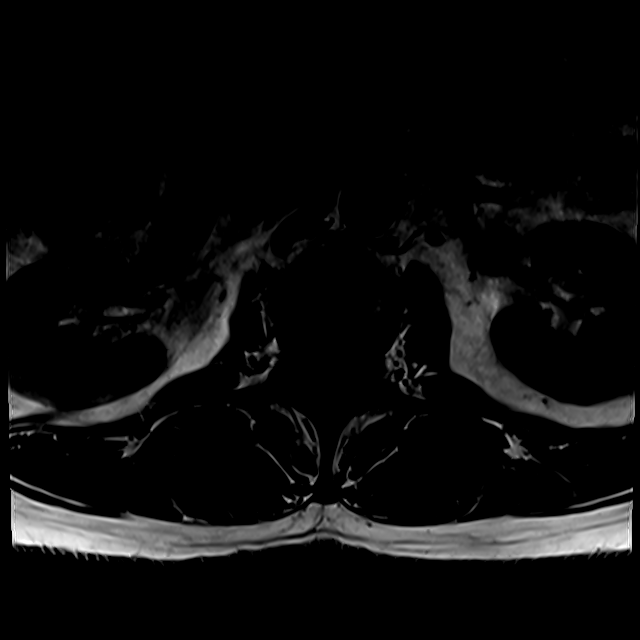

[18 of 48 positions shown; findings below may reference images not displayed]

FINDINGS: Segmentation:  Standard.

Alignment: Trace retrolisthesis of L3 on L4. Minimal left convex
curvature of the lumbar spine.

Vertebrae: No fracture or suspicious osseous lesion. Adjacent marrow
and soft tissue edema associated with L4-5 facet arthritis.

Conus medullaris: Extends to the upper L2 level and appears normal.

Paraspinal and other soft tissues: T2 hyperintense bilateral renal
lesions measuring up to 1 cm in size, likely cysts.

Disc levels:

Disc desiccation from L1-2 to L4-5. Preserved disc space heights
apart from mild narrowing at L4-5.

T12-L1 through L2-3:  Negative.

L3-4: Mild disc bulging and mild facet hypertrophy without stenosis.

L4-5: Disc bulging, broad central disc protrusion, small right
foraminal disc protrusion, mildly prominent dorsal epidural fat, and
moderate facet and ligamentum flavum hypertrophy result in severe
spinal stenosis, left greater than right lateral recess stenosis,
and mild bilateral neural foraminal stenosis. Minimal facet joint
fluid bilaterally.

L5-S1:  Normal disc.  Moderate facet arthrosis without stenosis.
IMPRESSION: 1. Severe spinal stenosis at L4-5 with a central disc protrusion
contributing.
2. Moderate L5-S1 facet arthrosis without stenosis.

## 2018-11-17 DIAGNOSIS — Z1231 Encounter for screening mammogram for malignant neoplasm of breast: Secondary | ICD-10-CM | POA: Diagnosis not present

## 2019-08-09 ENCOUNTER — Encounter: Payer: 59 | Admitting: Obstetrics & Gynecology

## 2019-11-07 ENCOUNTER — Other Ambulatory Visit: Payer: Self-pay

## 2019-11-08 ENCOUNTER — Encounter: Payer: Self-pay | Admitting: Obstetrics & Gynecology

## 2019-11-08 ENCOUNTER — Ambulatory Visit (INDEPENDENT_AMBULATORY_CARE_PROVIDER_SITE_OTHER): Payer: 59 | Admitting: Obstetrics & Gynecology

## 2019-11-08 VITALS — BP 126/80 | Ht 62.5 in | Wt 167.0 lb

## 2019-11-08 DIAGNOSIS — E6609 Other obesity due to excess calories: Secondary | ICD-10-CM

## 2019-11-08 DIAGNOSIS — Z780 Asymptomatic menopausal state: Secondary | ICD-10-CM | POA: Diagnosis not present

## 2019-11-08 DIAGNOSIS — Z6830 Body mass index (BMI) 30.0-30.9, adult: Secondary | ICD-10-CM | POA: Diagnosis not present

## 2019-11-08 DIAGNOSIS — Z01419 Encounter for gynecological examination (general) (routine) without abnormal findings: Secondary | ICD-10-CM | POA: Diagnosis not present

## 2019-11-08 NOTE — Patient Instructions (Signed)
1. Encounter for routine gynecological examination with Papanicolaou smear of cervix Normal gynecologic exam in menopause.  Pap reflex done.  Breast exam normal.  Per patient, screening mammogram done at work, will ask for report to be sent to me.  Needs to establish with a family physician with health labs annually.  2. Postmenopausal Well on no hormone replacement therapy.  No postmenopausal bleeding.  Recommend vitamin D supplements, calcium intake of 1200 mg daily and regular weightbearing physical activities to continue.  3. Class 1 obesity due to excess calories without serious comorbidity with body mass index (BMI) of 30.0 to 30.9 in adult Recommend a lower calorie/carb diet.  Aerobic physical activities 5 times a week and light weightlifting every 2 days.  Lauren Conrad, it was a pleasure seeing you today!  I will inform you of your results as soon as they are available.

## 2019-11-08 NOTE — Progress Notes (Signed)
Lauren Conrad [id number]   History:    59 y.o. G1P1L1 Married x 6 yrs  RP:  Established patient presenting for annual gyn exam   HPI: Menopause, well on no HRT.  No PMB.  No pelvic pain.  No pain with IC.  Urine/BMs normal.  Bulging disc.  BMI 30.06.  Doing water fitness with an exercise bath at home, walking, yard work, very active.  Past medical history,surgical history, family history and social history were all reviewed and documented in the EPIC chart.  Gynecologic History No LMP recorded. (Menstrual status: Perimenopausal).  Obstetric History OB History  Gravida Para Term Preterm AB Living  1 1       1   SAB TAB Ectopic Multiple Live Births               # Outcome Date GA Lbr Len/2nd Weight Sex Delivery Anes PTL Lv  1 Para              ROS: A ROS was performed and pertinent positives and negatives are included in the history.  GENERAL: No fevers or chills. HEENT: No change in vision, no earache, sore throat or sinus congestion. NECK: No pain or stiffness. CARDIOVASCULAR: No chest pain or pressure. No palpitations. PULMONARY: No shortness of breath, cough or wheeze. GASTROINTESTINAL: No abdominal pain, nausea, vomiting or diarrhea, melena or bright red blood per rectum. GENITOURINARY: No urinary frequency, urgency, hesitancy or dysuria. MUSCULOSKELETAL: No joint or muscle pain, no back pain, no recent trauma. DERMATOLOGIC: No rash, no itching, no lesions. ENDOCRINE: No polyuria, polydipsia, no heat or cold intolerance. No recent change in weight. HEMATOLOGICAL: No anemia or easy bruising or bleeding. NEUROLOGIC: No headache, seizures, numbness, tingling or weakness. PSYCHIATRIC: No depression, no loss of interest in normal activity or change in sleep pattern.     Exam:   BP 126/80 (BP Location: Right Arm, Patient Position: Sitting, Cuff Size: Normal)   Ht 5' 2.5" (1.588 m)   Wt 167 lb (75.8 kg)   BMI 30.06 kg/m   Body mass index is 30.06  kg/m.  General appearance : Well developed well nourished female. No acute distress HEENT: Eyes: no retinal hemorrhage or exudates,  Neck supple, trachea midline, no carotid bruits, no thyroidmegaly Lungs: Clear to auscultation, no rhonchi or wheezes, or rib retractions  Heart: Regular rate and rhythm, no murmurs or gallops Breast:Examined in sitting and supine position were symmetrical in appearance, no palpable masses or tenderness,  no skin retraction, no nipple inversion, no nipple discharge, no skin discoloration, no axillary or supraclavicular lymphadenopathy Abdomen: no palpable masses or tenderness, no rebound or guarding Extremities: no edema or skin discoloration or tenderness  Pelvic: Vulva: Normal             Vagina: No gross lesions or discharge  Cervix: No gross lesions or discharge.  Pap reflex done.  Uterus  AV, normal size, shape and consistency, non-tender and mobile  Adnexa  Without masses or tenderness  Anus: Normal   Assessment/Plan:  59 y.o. female for annual exam   1. Encounter for routine gynecological examination with Papanicolaou smear of cervix Normal gynecologic exam in menopause.  Pap reflex done.  Breast exam normal.  Per patient, screening mammogram done at work, will ask for report to be sent to me.  Needs to establish with a family physician with health labs annually.  2. Postmenopausal Well on no hormone replacement therapy.  No postmenopausal bleeding.  Recommend vitamin D supplements,  calcium intake of 1200 mg daily and regular weightbearing physical activities to continue.  3. Class 1 obesity due to excess calories without serious comorbidity with body mass index (BMI) of 30.0 to 30.9 in adult Recommend a lower calorie/carb diet.  Aerobic physical activities 5 times a week and light weightlifting every 2 days.  Princess Bruins MD, 4:12 PM 11/08/2019

## 2019-11-09 LAB — PAP IG W/ RFLX HPV ASCU

## 2020-11-08 ENCOUNTER — Encounter: Payer: Self-pay | Admitting: Obstetrics & Gynecology

## 2020-11-08 ENCOUNTER — Ambulatory Visit (INDEPENDENT_AMBULATORY_CARE_PROVIDER_SITE_OTHER): Payer: 59 | Admitting: Obstetrics & Gynecology

## 2020-11-08 ENCOUNTER — Other Ambulatory Visit: Payer: Self-pay

## 2020-11-08 VITALS — BP 128/84 | Ht 62.0 in | Wt 166.0 lb

## 2020-11-08 DIAGNOSIS — Z780 Asymptomatic menopausal state: Secondary | ICD-10-CM | POA: Diagnosis not present

## 2020-11-08 DIAGNOSIS — E6609 Other obesity due to excess calories: Secondary | ICD-10-CM | POA: Diagnosis not present

## 2020-11-08 DIAGNOSIS — Z6830 Body mass index (BMI) 30.0-30.9, adult: Secondary | ICD-10-CM

## 2020-11-08 DIAGNOSIS — Z01419 Encounter for gynecological examination (general) (routine) without abnormal findings: Secondary | ICD-10-CM

## 2020-11-08 NOTE — Progress Notes (Signed)
Lauren Conrad [facility] [birth year] [id number]   History:    60 y.o.  G1P1L1 Married x 7 yrs.  Son back at home.  OI:[id number] presenting for annual gyn exam   ZJQ:[id number], well on no HRT. No PMB. No pelvic pain.  No pain with IC.  Urine analysis with blood, referred to Urology by Fam MD.  BMs normal. BMI 30.36. Good fitness with walking, yard work, very active.  BD normal in 2018 per patient.  Health labs with Fam MD. Harriet Masson 2015.  Past medical history,surgical history, family history and social history were all reviewed and documented in the EPIC chart.  Gynecologic History No LMP recorded. (Menstrual status: Perimenopausal).  Obstetric History OB History  Gravida Para Term Preterm AB Living  1 1       1   SAB IAB Ectopic Multiple Live Births               # Outcome Date GA Lbr Len/2nd Weight Sex Delivery Anes PTL Lv  1 Para              ROS: A ROS was performed and pertinent positives and negatives are included in the history.  GENERAL: No fevers or chills. HEENT: No change in vision, no earache, sore throat or sinus congestion. NECK: No pain or stiffness. CARDIOVASCULAR: No chest pain or pressure. No palpitations. PULMONARY: No shortness of breath, cough or wheeze. GASTROINTESTINAL: No abdominal pain, nausea, vomiting or diarrhea, melena or bright red blood per rectum. GENITOURINARY: No urinary frequency, urgency, hesitancy or dysuria. MUSCULOSKELETAL: No joint or muscle pain, no back pain, no recent trauma. DERMATOLOGIC: No rash, no itching, no lesions. ENDOCRINE: No polyuria, polydipsia, no heat or cold intolerance. No recent change in weight. HEMATOLOGICAL: No anemia or easy bruising or bleeding. NEUROLOGIC: No headache, seizures, numbness, tingling or weakness. PSYCHIATRIC: No depression, no loss of interest in normal activity or change in sleep pattern.     Exam:   BP 128/84    Ht 5\' 2"  (1.575 m)    Wt 166 lb (75.3 kg)    BMI 30.36 kg/m   Body mass  index is 30.36 kg/m.  General appearance : Well developed well nourished female. No acute distress HEENT: Eyes: no retinal hemorrhage or exudates,  Neck supple, trachea midline, no carotid bruits, no thyroidmegaly Lungs: Clear to auscultation, no rhonchi or wheezes, or rib retractions  Heart: Regular rate and rhythm, no murmurs or gallops Breast:Examined in sitting and supine position were symmetrical in appearance, no palpable masses or tenderness,  no skin retraction, no nipple inversion, no nipple discharge, no skin discoloration, no axillary or supraclavicular lymphadenopathy Abdomen: no palpable masses or tenderness, no rebound or guarding Extremities: no edema or skin discoloration or tenderness  Pelvic: Vulva: Normal             Vagina: No gross lesions or discharge  Cervix: No gross lesions or discharge  Uterus  AV, normal size, shape and consistency, non-tender and mobile  Adnexa  Without masses or tenderness  Anus: Normal   Assessment/Plan:  60 y.o. female for annual exam   1. Well female exam with routine gynecological exam Normal gynecologic exam in menopause.  No indication to repeat a Pap test this year, Pap test - February 2021.  Breast exam normal.  Overdue for screening mammogram, will schedule.  Colonoscopy 2015.  Health labs with family physician.  2. Postmenopausal Well on no hormone replacement therapy.  No postmenopausal bleeding.  3. Class 1 obesity due  to excess calories without serious comorbidity with body mass index (BMI) of 30.0 to 30.9 in adult Mild obesity.  Recommend lower calorie/carb diet with intermittent fasting.  Aerobic activities five times a week and light weightlifting every 2 days.  Other orders - Multiple Vitamin (MULTIVITAMIN) tablet; Take 1 tablet by mouth daily.  Princess Bruins MD, 4:04 PM 11/08/2020

## 2020-11-09 ENCOUNTER — Encounter: Payer: Self-pay | Admitting: Obstetrics & Gynecology

## 2020-12-10 ENCOUNTER — Encounter: Payer: Self-pay | Admitting: Urology

## 2020-12-10 ENCOUNTER — Ambulatory Visit (INDEPENDENT_AMBULATORY_CARE_PROVIDER_SITE_OTHER): Payer: 59 | Admitting: Urology

## 2020-12-10 ENCOUNTER — Other Ambulatory Visit: Payer: Self-pay

## 2020-12-10 VITALS — BP 139/79 | HR 88 | Ht 62.0 in | Wt 166.0 lb

## 2020-12-10 DIAGNOSIS — R3129 Other microscopic hematuria: Secondary | ICD-10-CM

## 2020-12-10 DIAGNOSIS — N3289 Other specified disorders of bladder: Secondary | ICD-10-CM

## 2020-12-10 NOTE — Progress Notes (Signed)
86/76/1951 9:32 PM   Lauren Conrad [birth year] [id number]  Referring provider: Dionicia Abler, PA-C [address] [place],  [place] [address]  Chief Complaint  Patient presents with  . BLADDDER IRRITATION    HPI: I was consulted to assist the patient for microscopic hematuria.  She thinks she might see a little blood from time to time as she is dehydrated but was very nonspecific.  She describes being cystoscoped annually in our office in [place] by one of my partners and told she had a little bit of redness in her bladder.  Dating back to approximate 2004 she had mesh prolapse surgery or at least a female sling.  She said she had a ring placed around the urethra.  She is continent and voids every 1-2 hours.  She voids when she has her bladder full but really has decreased bladder sensation.  She does not feel urgency.  She gets up once or twice a night to void  She saw Dr. Alyson Ingles last in 2017.  He had noted she had had an anterior repair with mesh and sling.  She had a 5 to 8-year history in [facility] in [place] of having microscopic hematuria with 20-40 red blood cells per high-power field.  She had a hyperdense cyst in her kidneys.  Cystoscopy was normal  Patient gets a significant urinary tract infection after each cystoscopy     PMH: Past Medical History:  Diagnosis Date  . Arthritis   . Bulging lumbar disc   . GERD (gastroesophageal reflux disease)   . Seasonal allergies   . Stomach ulcer 2001    Surgical History: Past Surgical History:  Procedure Laterality Date  . ANTERIOR AND POSTERIOR REPAIR  2003    Home Medications:  Allergies as of 12/10/2020      Reactions   Erythromycin Nausea Only   Stomach cramps   Sulfa Antibiotics Nausea Only   Stomach cramps   Cortisone Rash      Medication List       Accurate as of December 10, 2020  1:22 PM. If you have any questions, ask your nurse or doctor.        multivitamin tablet Take 1 tablet by  mouth daily.   PRESCRIPTION MEDICATION Gel - rosacea   PRESCRIPTION MEDICATION Eye drops       Allergies:  Allergies  Allergen Reactions  . Erythromycin Nausea Only    Stomach cramps  . Sulfa Antibiotics Nausea Only    Stomach cramps  . Cortisone Rash    Family History: Family History  Problem Relation Age of Onset  . Colon cancer Neg Hx   . Esophageal cancer Neg Hx   . Rectal cancer Neg Hx   . Stomach cancer Neg Hx     Social History:  reports that she has never smoked. She has never used smokeless tobacco. She reports current alcohol use. She reports that she does not use drugs.  ROS:                                        Physical Exam: BP 139/79   Pulse 88   Ht 5\' 2"  (1.575 m)   Wt 75.3 kg   BMI 30.36 kg/m     Laboratory Data: Lab Results  Component Value Date   WBC 5.4 09/06/2008   HGB 13.8 09/06/2008   HCT 38.7 09/06/2008  MCV 88.3 09/06/2008   PLT 302 09/06/2008    Lab Results  Component Value Date   CREATININE 0.7 09/06/2008    No results found for: PSA  No results found for: TESTOSTERONE  No results found for: HGBA1C  Urinalysis    Component Value Date/Time   COLORURINE YELLOW 08/05/2018 [place] 08/05/2018 1649   LABSPEC 1.010 08/05/2018 1649   PHURINE 6.5 08/05/2018 1649   GLUCOSEU NEGATIVE 08/05/2018 1649   GLUCOSEU NEGATIVE 09/06/2008 1540   HGBUR 2+ (A) 08/05/2018 1649   BILIRUBINUR NEGATIVE 09/06/2008 [place] 08/05/2018 1649   PROTEINUR NEGATIVE 08/05/2018 1649   UROBILINOGEN 0.2 mg/dL 09/06/2008 1540   NITRITE Negative 09/06/2008 1540   LEUKOCYTESUR Moderate (A) 09/06/2008 1540    Pertinent Imaging: Chart reviewed.  Urine reviewed.  Urine sent for culture   Assessment & Plan: Patient I spoke about her chronic microscopic hematuria.  I talked about the guidelines.  She is a non-smoker.  I talked about differential diagnosis.  I told her I would cystoscope  her again if she wishes but I think she has been worked up so many times watchful waiting is a good option.  She chose this.  I will see her as needed.  She understands there is no perfect guarantee to rule out disease processes  1. Bladder irritation  - Urinalysis, Complete   No follow-ups on file.  Reece Packer, MD  [facility] [address] [place], [place] [address] [phone]

## 2020-12-11 LAB — URINALYSIS, COMPLETE
Bilirubin, UA: NEGATIVE
Glucose, UA: NEGATIVE
Ketones, UA: NEGATIVE
Nitrite, UA: NEGATIVE
Protein,UA: NEGATIVE
Specific Gravity, UA: 1.01 (ref 1.005–1.030)
Urobilinogen, Ur: 0.2 mg/dL (ref 0.2–1.0)
pH, UA: 6 (ref 5.0–7.5)

## 2020-12-11 LAB — MICROSCOPIC EXAMINATION: Bacteria, UA: NONE SEEN

## 2020-12-13 LAB — CULTURE, URINE COMPREHENSIVE

## 2020-12-17 ENCOUNTER — Telehealth: Payer: Self-pay

## 2020-12-17 MED ORDER — CEPHALEXIN 500 MG PO CAPS: 500.0000 mg | ORAL_CAPSULE | Freq: Three times a day (TID) | ORAL | 0 refills | Status: AC

## 2020-12-17 NOTE — Telephone Encounter (Signed)
-----   Message from Chrystie Nose, [place] sent at 12/17/2020  7:39 AM EDT -----  ----- Message ----- From: Bjorn Loser, MD Sent: 12/15/2020   5:03 PM EDT To: Chrystie Nose, CMA   Keflex 500 mg 3 times a day for 7 days  Low-grade urinary tract infection ----- Message ----- From: Chrystie Nose, CMA Sent: 12/12/2020   7:40 AM EDT To: Bjorn Loser, MD   ----- Message ----- From: Interface, [facility] Lab Results In Sent: 12/11/2020  11:37 AM EDT To: [facility]

## 2020-12-17 NOTE — Telephone Encounter (Signed)
Pt aware. Sent medication to pharmacy.

## 2021-03-21 ENCOUNTER — Other Ambulatory Visit: Payer: Self-pay | Admitting: Neurology

## 2021-03-21 DIAGNOSIS — H538 Other visual disturbances: Secondary | ICD-10-CM

## 2021-03-25 ENCOUNTER — Other Ambulatory Visit: Payer: Self-pay | Admitting: Neurology

## 2021-03-25 ENCOUNTER — Ambulatory Visit: Payer: 59

## 2021-03-25 DIAGNOSIS — H538 Other visual disturbances: Secondary | ICD-10-CM

## 2021-03-25 DIAGNOSIS — G43119 Migraine with aura, intractable, without status migrainosus: Secondary | ICD-10-CM

## 2021-04-04 ENCOUNTER — Ambulatory Visit
Admission: RE | Admit: 2021-04-04 | Discharge: 2021-04-04 | Disposition: A | Discharge status: Home / Self Care | Payer: 59 | Source: Ambulatory Visit | Attending: Neurology | Admitting: Neurology

## 2021-04-04 ENCOUNTER — Other Ambulatory Visit: Payer: Self-pay

## 2021-04-04 DIAGNOSIS — G43119 Migraine with aura, intractable, without status migrainosus: Secondary | ICD-10-CM

## 2021-04-04 DIAGNOSIS — H538 Other visual disturbances: Secondary | ICD-10-CM

## 2021-04-04 MED ORDER — GADOBENATE DIMEGLUMINE 529 MG/ML IV SOLN
15.0000 mL | Freq: Once | INTRAVENOUS | Status: AC | PRN
  Administered 2021-04-04: 15 mL via INTRAVENOUS

## 2021-10-21 ENCOUNTER — Encounter: Payer: Self-pay | Admitting: Obstetrics & Gynecology

## 2022-02-27 ENCOUNTER — Ambulatory Visit (INDEPENDENT_AMBULATORY_CARE_PROVIDER_SITE_OTHER): Payer: 59 | Admitting: Obstetrics & Gynecology

## 2022-02-27 ENCOUNTER — Encounter: Payer: Self-pay | Admitting: Obstetrics & Gynecology

## 2022-02-27 VITALS — BP 114/70

## 2022-02-27 DIAGNOSIS — N898 Other specified noninflammatory disorders of vagina: Secondary | ICD-10-CM

## 2022-02-27 DIAGNOSIS — Z87440 Personal history of urinary (tract) infections: Secondary | ICD-10-CM

## 2022-02-27 DIAGNOSIS — L439 Lichen planus, unspecified: Secondary | ICD-10-CM | POA: Diagnosis not present

## 2022-02-27 DIAGNOSIS — N760 Acute vaginitis: Secondary | ICD-10-CM | POA: Diagnosis not present

## 2022-02-27 LAB — WET PREP FOR TRICH, YEAST, CLUE

## 2022-02-27 NOTE — Progress Notes (Signed)
    Lauren Conrad [birth year] [id number]        61 y.o.  G1P1   RP: Brownish vaginal discharge with itching and burning  HPI: Brownish vaginal discharge with itching and burning.  Pain with intercourse.  No pelvic pain.  No PMB.  No urinary frequency.  BMs normal.  No fever.    OB History  Gravida Para Term Preterm AB Living  '1 1       1  '$ SAB IAB Ectopic Multiple Live Births               # Outcome Date GA Lbr Len/2nd Weight Sex Delivery Anes PTL Lv  1 Para             Past medical history,surgical history, problem list, medications, allergies, family history and social history were all reviewed and documented in the EPIC chart.   Directed ROS with pertinent positives and negatives documented in the history of present illness/assessment and plan.  Exam:  Vitals:   02/27/22 1411  BP: 114/70   General appearance:  Normal   Gynecologic exam: Vulva with very erythematous, inflamed plaques.  Erythema and inflammation at the peri-urethrally.  Speculum:  Entrance of the vagina with erythematous areas.  Wet Prep:  Negative, except for WBCs U/A: Yellow cloudy, Nit Neg, Pro Neg, WBC 20-40, RBC 10-20, Bacteria Moderate.  U. Culture pending.   Assessment/Plan:  61 y.o. G1P1L1   1. Vulvovaginitis Brownish vaginal discharge with itching and burning.  Pain with intercourse.  No pelvic pain.  No PMB.  No urinary frequency.  BMs normal.  No fever.   Vulva with very erythematous, inflamed plaques.  Erythema and inflammation at the peri-urethrally.  Probable Lichen Planus.  F/U Vulvar Bx.  2. Vaginal discharge Wet prep Negative, except for increased WBCs, probably d/t vulvar inflammation.   - WET PREP FOR [place], YEAST, CLUE  3. Lichen planus Typical severe inflammation of Lichen planus.  F/U Vulvar Biopsy.  4. History of UTI U/A probably abnormal from contamination at the vulva.  Will wait on the Urine Culture. - Urinalysis,Complete w/RFL Culture   Counseling and management of  vulvovaginitis and the possibility of lichen planus, review of documentation, for 25 minutes.  Princess Bruins MD, 2:37 PM 02/27/2022

## 2022-03-01 LAB — URINE CULTURE
MICRO NUMBER:: 13530184
Result:: NO GROWTH
SPECIMEN QUALITY:: ADEQUATE

## 2022-03-01 LAB — URINALYSIS, COMPLETE W/RFL CULTURE
Bilirubin Urine: NEGATIVE
Glucose, UA: NEGATIVE
Hyaline Cast: NONE SEEN /LPF
Ketones, ur: NEGATIVE
Nitrites, Initial: NEGATIVE
Protein, ur: NEGATIVE
Specific Gravity, Urine: 1.003 (ref 1.001–1.035)
pH: 6.5 (ref 5.0–8.0)

## 2022-03-01 LAB — CULTURE INDICATED

## 2022-03-02 ENCOUNTER — Encounter: Payer: Self-pay | Admitting: Obstetrics & Gynecology

## 2022-03-05 ENCOUNTER — Other Ambulatory Visit: Payer: Self-pay

## 2022-03-05 DIAGNOSIS — B3731 Acute candidiasis of vulva and vagina: Secondary | ICD-10-CM

## 2022-03-05 MED ORDER — TERCONAZOLE 0.8 % VA CREA: TOPICAL_CREAM | VAGINAL | 1 refills | Status: DC

## 2022-03-06 ENCOUNTER — Ambulatory Visit (INDEPENDENT_AMBULATORY_CARE_PROVIDER_SITE_OTHER): Payer: 59 | Admitting: Obstetrics & Gynecology

## 2022-03-06 ENCOUNTER — Other Ambulatory Visit (HOSPITAL_COMMUNITY)
Admission: RE | Admit: 2022-03-06 | Discharge: 2022-03-06 | Disposition: A | Discharge status: Home / Self Care | Payer: 59 | Source: Ambulatory Visit | Attending: Obstetrics and Gynecology | Admitting: Obstetrics and Gynecology

## 2022-03-06 ENCOUNTER — Ambulatory Visit (INDEPENDENT_AMBULATORY_CARE_PROVIDER_SITE_OTHER): Payer: 59

## 2022-03-06 ENCOUNTER — Encounter: Payer: Self-pay | Admitting: Obstetrics & Gynecology

## 2022-03-06 VITALS — BP 114/76

## 2022-03-06 DIAGNOSIS — L439 Lichen planus, unspecified: Secondary | ICD-10-CM | POA: Diagnosis present

## 2022-03-06 DIAGNOSIS — N763 Subacute and chronic vulvitis: Secondary | ICD-10-CM | POA: Insufficient documentation

## 2022-03-11 LAB — SURGICAL PATHOLOGY

## 2022-03-13 ENCOUNTER — Encounter: Payer: Self-pay | Admitting: Obstetrics & Gynecology

## 2022-03-28 ENCOUNTER — Encounter: Payer: Self-pay | Admitting: Obstetrics & Gynecology

## 2022-03-28 ENCOUNTER — Ambulatory Visit (INDEPENDENT_AMBULATORY_CARE_PROVIDER_SITE_OTHER): Payer: 59 | Admitting: Obstetrics & Gynecology

## 2022-03-28 VITALS — BP 112/70

## 2022-03-28 DIAGNOSIS — L439 Lichen planus, unspecified: Secondary | ICD-10-CM | POA: Diagnosis not present

## 2022-03-28 MED ORDER — CLOBETASOL PROPIONATE 0.05 % EX OINT: 1.0000 | TOPICAL_OINTMENT | Freq: Every day | CUTANEOUS | 3 refills | Status: AC

## 2022-03-28 NOTE — Progress Notes (Signed)
    Lauren Conrad [birth year] [id number]        61 y.o.  G1P1L1  RP: Vulvitis post Vulvar Bx 03/06/22  HPI:  Had light spotting from the Rt vulvar Bx site 4 days ago, not currently bleeding.  Itching and burning.  Pain with intercourse.  No pelvic pain.  No PMB.  No urinary frequency.  BMs normal.  No fever.   OB History  Gravida Para Term Preterm AB Living  '1 1       1  '$ SAB IAB Ectopic Multiple Live Births               # Outcome Date GA Lbr Len/2nd Weight Sex Delivery Anes PTL Lv  1 Para             Past medical history,surgical history, problem list, medications, allergies, family history and social history were all reviewed and documented in the EPIC chart.   Directed ROS with pertinent positives and negatives documented in the history of present illness/assessment and plan.  Exam:  Vitals:   03/28/22 1200  BP: 112/70   General appearance:  Normal  Gynecologic exam: Vulva:  Very erythematous at the introitus bilaterally, unchanged.  Bx site well healed at the Rt inner vulva.  Patho 03/06/22: FINAL MICROSCOPIC DIAGNOSIS:   A.   VULVA, BIOPSY: LICHENOID DERMATITIS, SEE DESCRIPTION  COMMENT: Sections show benign epithelial hyperplasia, subjacent to which  is a band-like lichenoid lymphoplasmacytic infiltrate. PAS staining is  negative for fungal elements or Candida. This pattern can be seen in  lichen planus, non-specific lichenoid dermatitis due to drugs, or, less  likely, chronic contact dermatitis with lichenoid inflammation. Dr.  Nira Retort has a similar opinion.    Assessment/Plan:  61 y.o. G1P1   1. Lichen planus Had light spotting from the Rt vulvar Bx site 4 days ago, not currently bleeding.  Itching and burning.  Pain with intercourse.  No pelvic pain.  No PMB.  No urinary frequency.  BMs normal.  No fever.  On Gyn exam, Bx site well healed, stitch fell off.  Vulvar Bx c/w Lichen Planus.  Counseling done on how Lichen Planus can affect the mucosa at other  locations, namely in the mouth or along the bowels.  Will treat with Clobetasol 0.05% ointment.  Prescription sent to pharmacy.  Other orders - clobetasol ointment (TEMOVATE) 0.05 %; Apply 1 Application topically daily. Thin application on the affected vulva daily x 2 weeks, then 3 times a week x 2 weeks, then twice a week long term.   Counseling on Lichen Planus and its management for 15 minutes.  Princess Bruins MD, 12:23 PM 03/28/2022

## 2022-04-05 ENCOUNTER — Emergency Department: Payer: 59

## 2022-04-05 ENCOUNTER — Other Ambulatory Visit: Payer: Self-pay

## 2022-04-05 ENCOUNTER — Emergency Department
Admission: EM | Admit: 2022-04-05 | Discharge: 2022-04-05 | Disposition: A | Discharge status: Home / Self Care | Payer: 59 | Attending: Emergency Medicine | Admitting: Emergency Medicine

## 2022-04-05 DIAGNOSIS — T782XXA Anaphylactic shock, unspecified, initial encounter: Secondary | ICD-10-CM | POA: Insufficient documentation

## 2022-04-05 DIAGNOSIS — R0602 Shortness of breath: Secondary | ICD-10-CM | POA: Diagnosis present

## 2022-04-05 LAB — TROPONIN I (HIGH SENSITIVITY)
Troponin I (High Sensitivity): 5 ng/L (ref <18)
Troponin I (High Sensitivity): 6 ng/L (ref <18)

## 2022-04-05 LAB — COMPREHENSIVE METABOLIC PANEL
ALT: 59 U/L — ABNORMAL HIGH (ref 0–44)
AST: 46 U/L — ABNORMAL HIGH (ref 15–41)
Albumin: 3.9 g/dL (ref 3.5–5.0)
Alkaline Phosphatase: 53 U/L (ref 38–126)
Anion gap: 12 (ref 5–15)
BUN: 18 mg/dL (ref 8–23)
CO2: 22 mmol/L (ref 22–32)
Calcium: 8.7 mg/dL — ABNORMAL LOW (ref 8.9–10.3)
Chloride: 105 mmol/L (ref 98–111)
Creatinine, Ser: 0.88 mg/dL (ref 0.44–1.00)
GFR, Estimated: >60 mL/min (ref >60)
Glucose, Bld: 175 mg/dL — ABNORMAL HIGH (ref 70–99)
Potassium: 3.1 mmol/L — ABNORMAL LOW (ref 3.5–5.1)
Sodium: 139 mmol/L (ref 135–145)
Total Bilirubin: 1.4 mg/dL — ABNORMAL HIGH (ref 0.3–1.2)
Total Protein: 6.9 g/dL (ref 6.5–8.1)

## 2022-04-05 LAB — CBC WITH DIFFERENTIAL/PLATELET
Abs Immature Granulocytes: 0.03 10*3/uL (ref 0.00–0.07)
Basophils Absolute: 0 10*3/uL (ref 0.0–0.1)
Basophils Relative: 0 %
Eosinophils Absolute: 0 10*3/uL (ref 0.0–0.5)
Eosinophils Relative: 0 %
HCT: 42.2 % (ref 36.0–46.0)
Hemoglobin: 14 g/dL (ref 12.0–15.0)
Immature Granulocytes: 1 %
Lymphocytes Relative: 39 %
Lymphs Abs: 2.2 10*3/uL (ref 0.7–4.0)
MCH: 29.9 pg (ref 26.0–34.0)
MCHC: 33.2 g/dL (ref 30.0–36.0)
MCV: 90.2 fL (ref 80.0–100.0)
Monocytes Absolute: 0.2 10*3/uL (ref 0.1–1.0)
Monocytes Relative: 3 %
Neutro Abs: 3.1 10*3/uL (ref 1.7–7.7)
Neutrophils Relative %: 57 %
Platelets: 223 10*3/uL (ref 150–400)
RBC: 4.68 MIL/uL (ref 3.87–5.11)
RDW: 11.9 % (ref 11.5–15.5)
WBC: 5.5 10*3/uL (ref 4.0–10.5)
nRBC: 0 % (ref 0.0–0.2)

## 2022-04-05 MED ORDER — EPINEPHRINE 0.3 MG/0.3ML IJ SOAJ: 0.3000 mg | INTRAMUSCULAR | 2 refills | Status: AC | PRN

## 2022-04-05 MED ORDER — FAMOTIDINE IN NACL 20-0.9 MG/50ML-% IV SOLN
20.0000 mg | Freq: Once | INTRAVENOUS | Status: AC
  Administered 2022-04-05: 20 mg via INTRAVENOUS
  Filled 2022-04-05: qty 50

## 2022-04-05 MED ORDER — DIPHENHYDRAMINE HCL 50 MG/ML IJ SOLN
50.0000 mg | Freq: Once | INTRAMUSCULAR | Status: AC
  Administered 2022-04-05: 50 mg via INTRAVENOUS
  Filled 2022-04-05: qty 1

## 2022-04-05 MED ORDER — PREDNISONE 20 MG PO TABS
20.0000 mg | ORAL_TABLET | Freq: Once | ORAL | Status: AC
Start: 2022-04-05 — End: 2022-04-05
  Administered 2022-04-05: 20 mg via ORAL
  Filled 2022-04-05: qty 1

## 2022-04-05 MED ORDER — METHYLPREDNISOLONE SODIUM SUCC 125 MG IJ SOLR
125.0000 mg | Freq: Once | INTRAMUSCULAR | Status: AC
  Administered 2022-04-05: 125 mg via INTRAVENOUS
  Filled 2022-04-05: qty 2

## 2022-04-05 MED ORDER — PREDNISONE 20 MG PO TABS: 20.0000 mg | ORAL_TABLET | Freq: Every day | ORAL | 0 refills | Status: DC

## 2022-04-05 MED ORDER — SODIUM CHLORIDE 0.9 % IV BOLUS
1000.0000 mL | Freq: Once | INTRAVENOUS | Status: AC
  Administered 2022-04-05: 1000 mL via INTRAVENOUS

## 2022-04-05 NOTE — ED Provider Notes (Addendum)
[facility] Provider Note    Event Date/Time   First MD Initiated Contact with Patient 04/05/22 1151     (approximate)   History   Allergic Reaction   HPI  Lauren Conrad is a 61 y.o. female who was stung by something on her buttocks area.  She got short of breath and chest tightness and passed out twice afterwards.  EMS gave her epi and she began improving.  When she arrived here she still had some tightness in her throat and shortness of breath.  This then resolved with Benadryl steroids and Pepcid.      Physical Exam   Triage Vital Signs: ED Triage Vitals  Enc Vitals Group     BP 04/05/22 1130 (!) 104/50     Pulse Rate 04/05/22 1130 88     Resp 04/05/22 1130 15     Temp 04/05/22 1130 97.9 F (36.6 C)     Temp Source 04/05/22 1130 Oral     SpO2 04/05/22 1130 99 %     Weight 04/05/22 1205 165 lb (74.8 kg)     Height 04/05/22 1205 5' 2.5" (1.588 m)     Head Circumference --      Peak Flow --      Pain Score 04/05/22 1131 0     Pain Loc --      Pain Edu? --      Excl. in [place]? --     Most recent vital signs: Vitals:   04/05/22 1530 04/05/22 1600  BP: 123/77 117/66  Pulse: 92 96  Resp: (!) 23 (!) 21  Temp:    SpO2: 97% 96%     General: Awake, initially patient still had tightness in her throat and shortness of breath. Mouth: No erythema or exudate Neck supple no stridor CV:  Good peripheral perfusion.  Resp:  Normal effort.  Lungs were clear Abd:  No distention.  Soft and nontender Skin with no rash extremities with no edema   ED Results / Procedures / Treatments   Labs (all labs ordered are listed, but only abnormal results are displayed) Labs Reviewed  COMPREHENSIVE METABOLIC PANEL - Abnormal; Notable for the following components:      Result Value   Potassium 3.1 (*)    Glucose, Bld 175 (*)    Calcium 8.7 (*)    AST 46 (*)    ALT 59 (*)    Total Bilirubin 1.4 (*)    All other components within normal limits  CBC  WITH DIFFERENTIAL/PLATELET  TROPONIN I (HIGH SENSITIVITY)  TROPONIN I (HIGH SENSITIVITY)     EKG  EKG read and interpreted by me shows normal sinus rhythm rate of 88 normal axis slight ST segment depression inferiorly while she is having her symptoms from her anaphylaxis.   RADIOLOGY Chest x-ray read by radiology reviewed by me is negative   PROCEDURES:  Critical Care performed:   Procedures   MEDICATIONS ORDERED IN ED: Medications  sodium chloride 0.9 % bolus 1,000 mL (0 mLs Intravenous Stopped 04/05/22 1456)  methylPREDNISolone sodium succinate (SOLU-MEDROL) 125 mg/2 mL injection 125 mg (125 mg Intravenous Given 04/05/22 1217)  diphenhydrAMINE (BENADRYL) injection 50 mg (50 mg Intravenous Given 04/05/22 1219)  famotidine (PEPCID) IVPB 20 mg premix (0 mg Intravenous Stopped 04/05/22 1456)  predniSONE (DELTASONE) tablet 20 mg (20 mg Oral Given 04/05/22 1546)     IMPRESSION / MDM / ASSESSMENT AND PLAN / ED COURSE  I reviewed the triage vital signs  and the nursing notes. Patient's troponins are negative symptoms resolved patient wants to go home I will let her do that.  Differential diagnosis includes, but is not limited to, there is a chance that this could have been some cardiac angina or syncope but most likely this was an anaphylaxis episode.  Patient's troponins were stable and negative everything else looked good monitor showed no signs of any other problems.  Patient's presentation is most consistent with acute presentation with potential threat to life or bodily function.  The patient is on the cardiac monitor to evaluate for evidence of arrhythmia and/or significant heart rate changes.  None were seen      FINAL CLINICAL IMPRESSION(S) / ED DIAGNOSES   Final diagnoses:  Anaphylaxis, initial encounter     Rx / DC Orders   ED Discharge Orders          Ordered    EPINEPHrine (EPIPEN 2-PAK) 0.3 mg/0.3 mL IJ SOAJ injection  As needed        04/05/22 1511     predniSONE (DELTASONE) 20 MG tablet  Daily with breakfast        04/05/22 1512             Note:  This document was prepared using Dragon voice recognition software and may include unintentional dictation errors.   Nena Polio, MD 04/05/22 1627 EKG #2 read and interpreted by me shows normal sinus rhythm rate of 91 normal axis the changes that were minimally present inferiorly are now gone.  I have the patient follow-up.  I discussed this in detail with her she said she probably will follow-up with her husband's cardiologist.   Nena Polio, MD 04/05/22 [phone]

## 2022-04-05 NOTE — ED Triage Notes (Signed)
Pt presents to ED with c/o of being stung by something on her L buttock area. Pt states she did have a +LOC after the stung. EMS gave 0.'5mg'$  of epi IM, no access was able to be obtained. Pt states her throat feels tight, no coughing noted. Pt denies any worsening SOB and does endorse she feels better than she initially did. Pt is A&XO4 at this time.

## 2022-04-05 NOTE — Discharge Instructions (Addendum)
Take the Benadryl 25 mg 1 4 times a day to help prevent the anaphylactic reaction from reoccurring.  I would take 2 more doses today about 6 hours apart.  Tomorrow take the prednisone 1 dose and continue the Benadryl for total of 4 more doses.  After that you can stop.  I have given you a prescription for an EpiPen which you can use if this happens again.  If you use the EpiPen be sure to come back to the ER.  If the symptoms recur after you leave today please return at once again.  I would call the ambulance.  Please follow-up with your primary care doctor. Have him check the EKG that we did when he first got here there are some mild changes that possibly he should look at.  Cardiology can do that as well.

## 2022-04-25 ENCOUNTER — Encounter (HOSPITAL_COMMUNITY): Payer: Self-pay

## 2022-04-25 ENCOUNTER — Emergency Department (HOSPITAL_COMMUNITY)
Admission: EM | Admit: 2022-04-25 | Discharge: 2022-04-25 | Disposition: A | Discharge status: Home / Self Care | Payer: 59 | Attending: Emergency Medicine | Admitting: Emergency Medicine

## 2022-04-25 ENCOUNTER — Other Ambulatory Visit: Payer: Self-pay

## 2022-04-25 DIAGNOSIS — T8051XA Anaphylactic reaction due to administration of blood and blood products, initial encounter: Secondary | ICD-10-CM | POA: Diagnosis not present

## 2022-04-25 DIAGNOSIS — X58XXXA Exposure to other specified factors, initial encounter: Secondary | ICD-10-CM | POA: Diagnosis not present

## 2022-04-25 DIAGNOSIS — L509 Urticaria, unspecified: Secondary | ICD-10-CM | POA: Insufficient documentation

## 2022-04-25 DIAGNOSIS — Z91040 Latex allergy status: Secondary | ICD-10-CM | POA: Insufficient documentation

## 2022-04-25 DIAGNOSIS — T782XXA Anaphylactic shock, unspecified, initial encounter: Secondary | ICD-10-CM

## 2022-04-25 MED ORDER — METHYLPREDNISOLONE SODIUM SUCC 125 MG IJ SOLR
125.0000 mg | Freq: Once | INTRAMUSCULAR | Status: AC
Start: 2022-04-25 — End: 2022-04-25
  Administered 2022-04-25: 125 mg via INTRAVENOUS
  Filled 2022-04-25: qty 2

## 2022-04-25 MED ORDER — PREDNISONE 20 MG PO TABS: ORAL_TABLET | ORAL | 0 refills | Status: AC

## 2022-04-25 MED ORDER — SODIUM CHLORIDE 0.9 % IV BOLUS
500.0000 mL | Freq: Once | INTRAVENOUS | Status: AC
  Administered 2022-04-25: 500 mL via INTRAVENOUS

## 2022-04-25 NOTE — ED Triage Notes (Signed)
Pt to er room number 13 via ems, per ems pt was bit by something and had an allergic reaction, states that she took her epi pen, then fire gave another epi and ems gave an epi, states that they also gave '50mg'$  of benadryl.

## 2022-04-25 NOTE — ED Provider Notes (Signed)
[facility] DEPT Provider Note   CSN: [id number] Arrival date & time: 04/25/22  1641     History  Chief Complaint  Patient presents with   Allergic Reaction    Lauren Conrad is a 61 y.o. female.  Patient is a 61 year old female who presents after allergic reaction.  She states she was down around her water heater and felt like something either bit or stung her on her left lower leg.  Shortly after that she developed hives and felt weak all over.  She had some associated nausea and vomiting.  She said that she started to pass out several times but she never lost consciousness.  She gave herself her EpiPen.  She was brought in by EMS.  On fire department arrival, she was hypotensive with a blood pressure in the 80s.  They administered epinephrine 0.3 mg IM.  On EMS arrival, her blood pressure was in the 90s and she was still vomiting.  She received Benadryl.  During transport, she still had not had much improvement and she was given a third IM epi at 0.3 mg.  She is feeling better now.  She has no ongoing vomiting.  No chest pain or shortness of breath.  No swelling to her lips or tongue.  She says her rash has mostly gone away.       Home Medications Prior to Admission medications   Medication Sig Start Date End Date Taking? Authorizing Provider  CALCIUM-VITAMIN D PO Take 1 tablet by mouth once a week.   Yes [provider]  clobetasol ointment (TEMOVATE) 1.55 % Apply 1 Application topically daily. Thin application on the affected vulva daily x 2 weeks, then 3 times a week x 2 weeks, then twice a week long term. Patient taking differently: Apply 1 Application topically daily as needed (skin irritation). 03/28/22  Yes Princess Bruins, MD  diphenhydrAMINE (BENADRYL) 25 MG tablet Take 25 mg by mouth every 6 (six) hours as needed for allergies.   Yes [provider]  EPINEPHrine (EPIPEN 2-PAK) 0.3 mg/0.3 mL IJ SOAJ injection Inject 0.3 mg  into the muscle as needed for anaphylaxis. 04/05/22  Yes Nena Polio, MD  fluorouracil (EFUDEX) 5 % cream Apply 1 Application topically daily as needed (skin irritation). 04/29/17  Yes [provider]  metroNIDAZOLE (METROGEL) 0.75 % gel Apply 1 Application topically daily as needed (rosacea). 01/29/16  Yes [provider]  Multiple Vitamins-Minerals (MULTIVITAMIN ADULT) CHEW Chew 2 tablets by mouth daily.   Yes [provider]  predniSONE (DELTASONE) 20 MG tablet 1 tabs po daily x 4 days 04/25/22  Yes Malvin Johns, MD  PRESCRIPTION MEDICATION Apply 1 Application topically daily as needed (rosacea). Gel - rosacea   Yes [provider]  triamcinolone cream (KENALOG) 0.1 % Apply 1 Application topically daily as needed (skin irritation). 04/29/18  Yes [provider]      Allergies    Erythromycin, Latex, Sulfa antibiotics, and Cortisone    Review of Systems   Review of Systems  Constitutional:  Positive for fatigue. Negative for chills, diaphoresis and fever.  HENT:  Negative for congestion, rhinorrhea and sneezing.   Eyes: Negative.   Respiratory:  Negative for cough, chest tightness and shortness of breath.   Cardiovascular:  Negative for chest pain and leg swelling.  Gastrointestinal:  Positive for nausea and vomiting. Negative for abdominal pain, blood in stool and diarrhea.  Genitourinary:  Negative for difficulty urinating, flank pain, frequency and hematuria.  Musculoskeletal:  Negative for arthralgias and back pain.  Skin:  Positive for rash.  Neurological:  Positive for light-headedness. Negative for dizziness, speech difficulty, weakness, numbness and headaches.    Physical Exam Updated Vital Signs BP (!) 153/117   Pulse 95   Temp 97.9 F (36.6 C) (Oral)   Resp 18   Ht 5' 2.5" (1.588 m)   Wt 74.8 kg   SpO2 93%   BMI 29.70 kg/m  Physical Exam Constitutional:      Appearance: She is well-developed.  HENT:     Head:  Normocephalic and atraumatic.  Eyes:     Pupils: Pupils are equal, round, and reactive to light.  Cardiovascular:     Rate and Rhythm: Normal rate and regular rhythm.     Heart sounds: Normal heart sounds.  Pulmonary:     Effort: Pulmonary effort is normal. No respiratory distress.     Breath sounds: Normal breath sounds. No wheezing or rales.  Chest:     Chest wall: No tenderness.  Abdominal:     General: Bowel sounds are normal.     Palpations: Abdomen is soft.     Tenderness: There is no abdominal tenderness. There is no guarding or rebound.  Musculoskeletal:        General: Normal range of motion.     Cervical back: Normal range of motion and neck supple.  Lymphadenopathy:     Cervical: No cervical adenopathy.  Skin:    General: Skin is warm and dry.     Findings: No rash.     Comments: 2 small erythematous areas to her left lower leg, no other hives, no puncture wounds  Neurological:     Mental Status: She is alert and oriented to person, place, and time.  Psychiatric:     Comments: Slightly anxious and jittery     ED Results / Procedures / Treatments   Labs (all labs ordered are listed, but only abnormal results are displayed) Labs Reviewed - No data to display  EKG EKG Interpretation  Date/Time:  Friday April 25 2022 17:10:21 EDT Ventricular Rate:  89 PR Interval:  141 QRS Duration: 91 QT Interval:  399 QTC Calculation: 486 R Axis:   49 Text Interpretation: Sinus rhythm Low voltage, precordial leads Borderline prolonged QT interval since last tracing no significant change Confirmed by Malvin Johns [phone]) on 04/25/2022 5:31:14 PM  Radiology No results found.  Procedures Procedures    Medications Ordered in ED Medications  sodium chloride 0.9 % bolus 500 mL (0 mLs Intravenous Stopped 04/25/22 1927)  methylPREDNISolone sodium succinate (SOLU-MEDROL) 125 mg/2 mL injection 125 mg (125 mg Intravenous Given 04/25/22 1715)    ED Course/ Medical Decision  Making/ A&P                           Medical Decision Making Problems Addressed: Anaphylaxis, initial encounter: acute illness or injury that poses a threat to life or bodily functions  Amount and/or Complexity of Data Reviewed Independent Historian: EMS External Data Reviewed: notes.    Details: Prior ED notes ECG/medicine tests: ordered and independent interpretation performed. Decision-making details documented in ED Course.  Risk Prescription drug management. Decision regarding hospitalization.   Patient is a 61 year old female who presents after anaphylactic reaction.  She was given 3 epi's in the field and Benadryl.  She was given Solu-Medrol here.  She was feeling much better once she had arrived to the ED.  She has  no angioedema.  Her blood pressures been stable.  She was monitored for 4 hours and had no return of symptoms.  No hypoxia or hypotension.  Given that she has had no recurrence of symptoms after the prolonged observation period, I feel that she is appropriate for outpatient treatment and at this point does not need hospitalization.  She was given Solu-Medrol in the ED.  We will place her on a 4-day course of prednisone.  She has EpiPen's at home to use as needed.  She was encouraged to follow-up with her primary care doctor for recheck and may need a referral to an allergist.  Return precautions were given.  CRITICAL CARE Performed by: Malvin Johns Total critical care time: 60 minutes Critical care time was exclusive of separately billable procedures and treating other patients. Critical care was necessary to treat or prevent imminent or life-threatening deterioration. Critical care was time spent personally by me on the following activities: development of treatment plan with patient and/or surrogate as well as nursing, discussions with consultants, evaluation of patient's response to treatment, examination of patient, obtaining history from patient or surrogate,  ordering and performing treatments and interventions, ordering and review of laboratory studies, ordering and review of radiographic studies, pulse oximetry and re-evaluation of patient's condition.   Final Clinical Impression(s) / ED Diagnoses Final diagnoses:  Anaphylaxis, initial encounter    Rx / DC Orders ED Discharge Orders          Ordered    predniSONE (DELTASONE) 20 MG tablet        04/25/22 2059              Malvin Johns, MD 04/25/22 2102

## 2023-12-04 ENCOUNTER — Encounter: Payer: Self-pay | Admitting: Gastroenterology

## 2024-04-01 ENCOUNTER — Encounter: Payer: Self-pay | Admitting: Advanced Practice Midwife

## 2024-09-22 ENCOUNTER — Other Ambulatory Visit: Payer: Self-pay

## 2024-09-22 MED ORDER — EPINEPHRINE 0.3 MG/0.3ML IJ SOAJ
INTRAMUSCULAR | 1 refills | Status: AC
  Filled 2024-09-22: qty 2, 30d supply, fill #0

## 2024-09-28 ENCOUNTER — Other Ambulatory Visit: Payer: Self-pay

## 2024-09-29 ENCOUNTER — Other Ambulatory Visit: Payer: Self-pay
